# Patient Record
Sex: Male | Born: 1970
Health system: Southern US, Community
[De-identification: ages and names within clinical notes are randomized; demographics above are authoritative.]

## PROBLEM LIST (undated history)

## (undated) DIAGNOSIS — Z9889 Other specified postprocedural states: Secondary | ICD-10-CM

## (undated) DIAGNOSIS — E7800 Pure hypercholesterolemia, unspecified: Secondary | ICD-10-CM

## (undated) DIAGNOSIS — E349 Endocrine disorder, unspecified: Secondary | ICD-10-CM

## (undated) DIAGNOSIS — R9431 Abnormal electrocardiogram [ECG] [EKG]: Secondary | ICD-10-CM

## (undated) HISTORY — DX: Other specified postprocedural states: Z98.890

---

## 2004-08-07 HISTORY — PX: LAPAROSCOPY: SHX197

## 2004-12-31 ENCOUNTER — Inpatient Hospital Stay (HOSPITAL_COMMUNITY): Admission: EM | Admit: 2004-12-31 | Discharge: 2005-01-03 | Payer: Self-pay | Admitting: Emergency Medicine

## 2004-12-31 ENCOUNTER — Ambulatory Visit: Payer: Self-pay | Admitting: Gastroenterology

## 2005-01-05 ENCOUNTER — Ambulatory Visit (HOSPITAL_COMMUNITY): Admission: RE | Admit: 2005-01-05 | Discharge: 2005-01-05 | Payer: Self-pay | Admitting: Gastroenterology

## 2005-01-19 ENCOUNTER — Ambulatory Visit (HOSPITAL_COMMUNITY): Admission: RE | Admit: 2005-01-19 | Discharge: 2005-01-19 | Payer: Self-pay | Admitting: Gastroenterology

## 2005-12-31 IMAGING — CR DG ABDOMEN ACUTE W/ 1V CHEST
3 series · 3 of 3 positions shown · non-contrast
Comparison: none

CLINICAL DATA: Epigastric pain since this morning.
 ABDOMEN ? 2 VIEW:
 Supine and upright views show multiple small bowel air fluid levels as well as some air fluid levels in the colon.  These findings could be seen in gastroenteritis.  I do not see any dilated loops to suggest obstruction.  It is conceivable this could be an early small bowel obstruction, I do not favor that.  No abnormal calcifications or bony findings.  No free air.  There are two phleboliths in the right side of the pelvis.

[w chest pa]
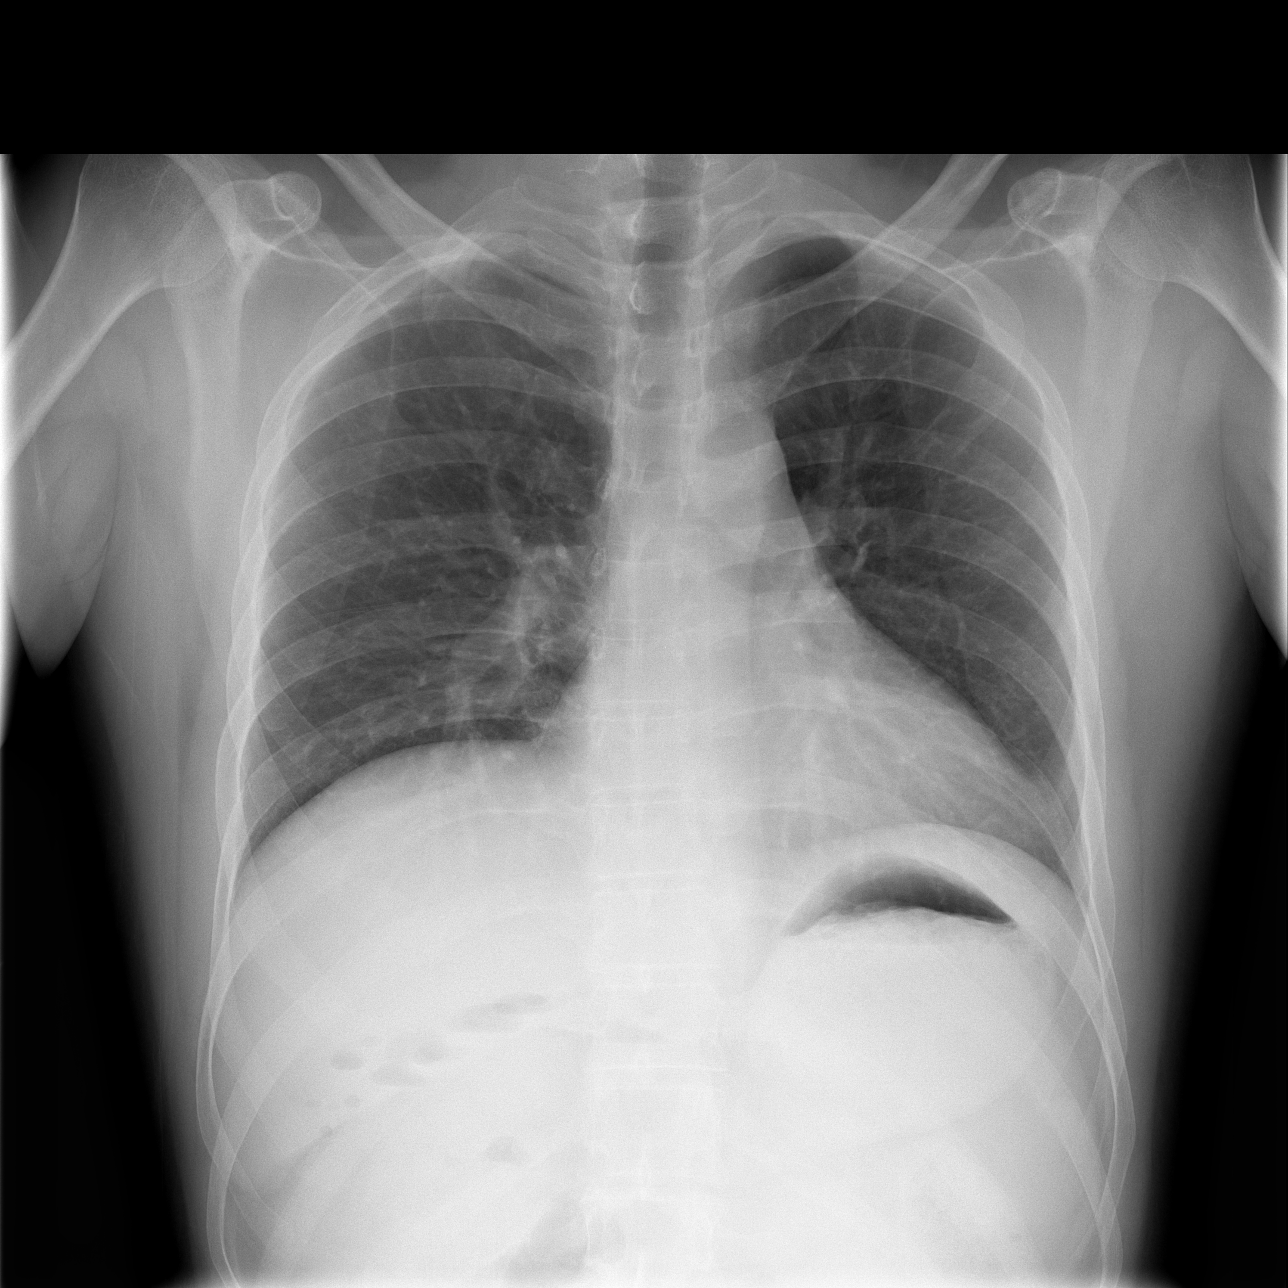

[w abdomen upright *]
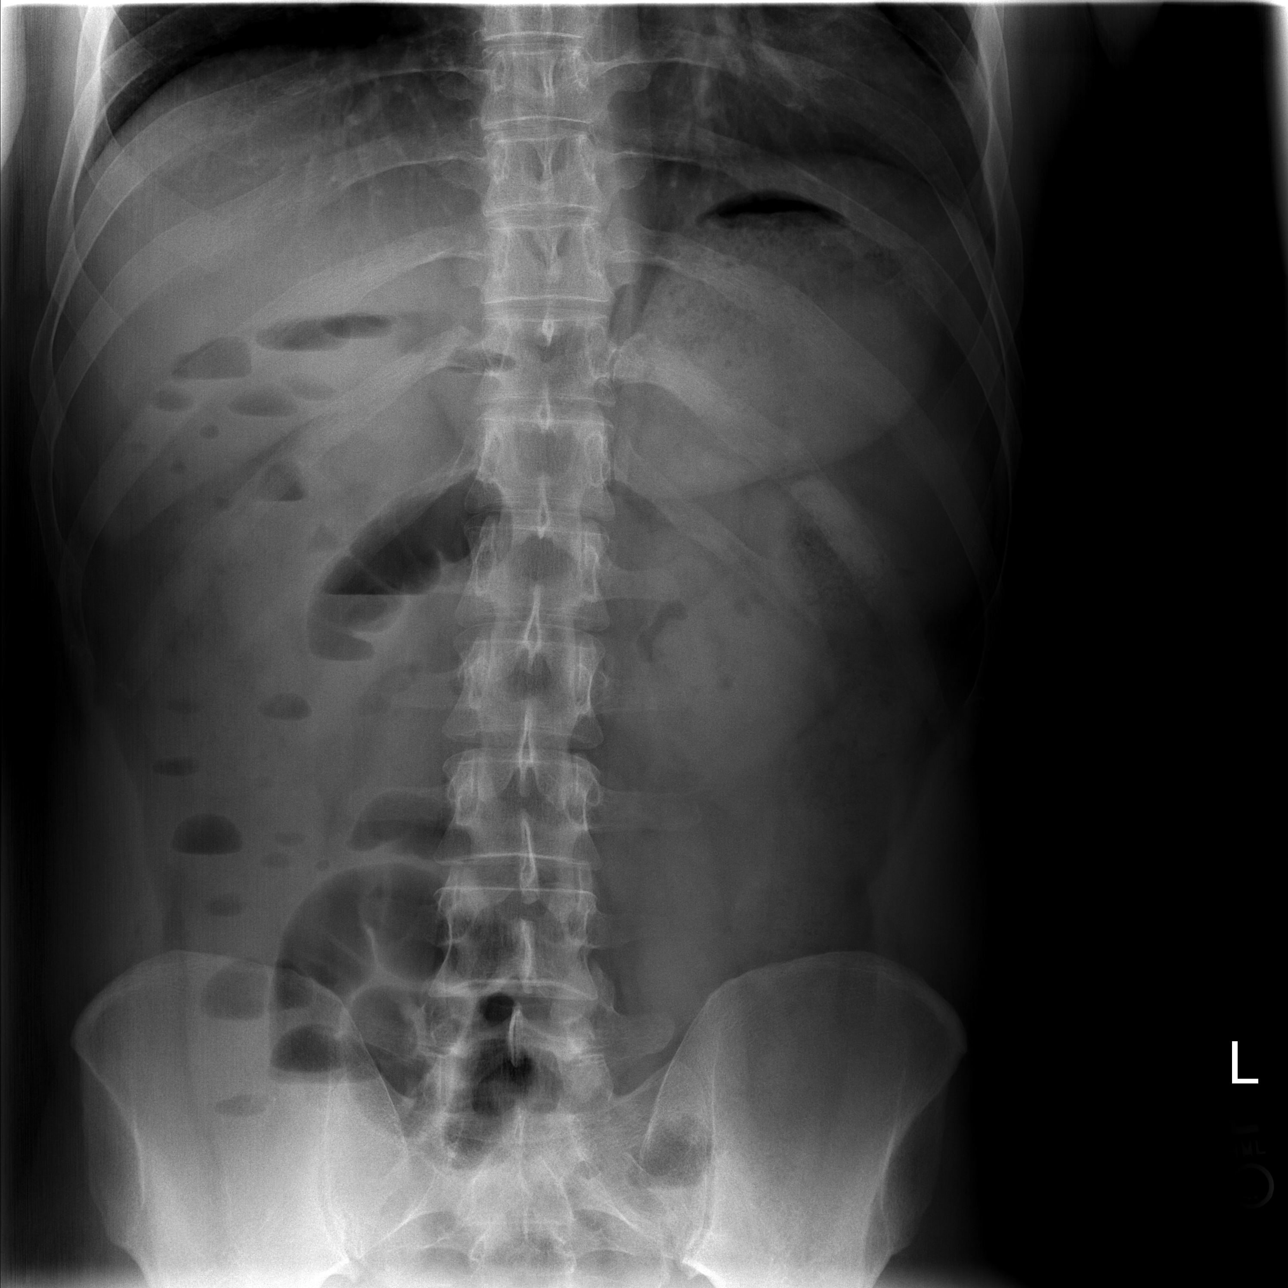

[t abdomen supine]
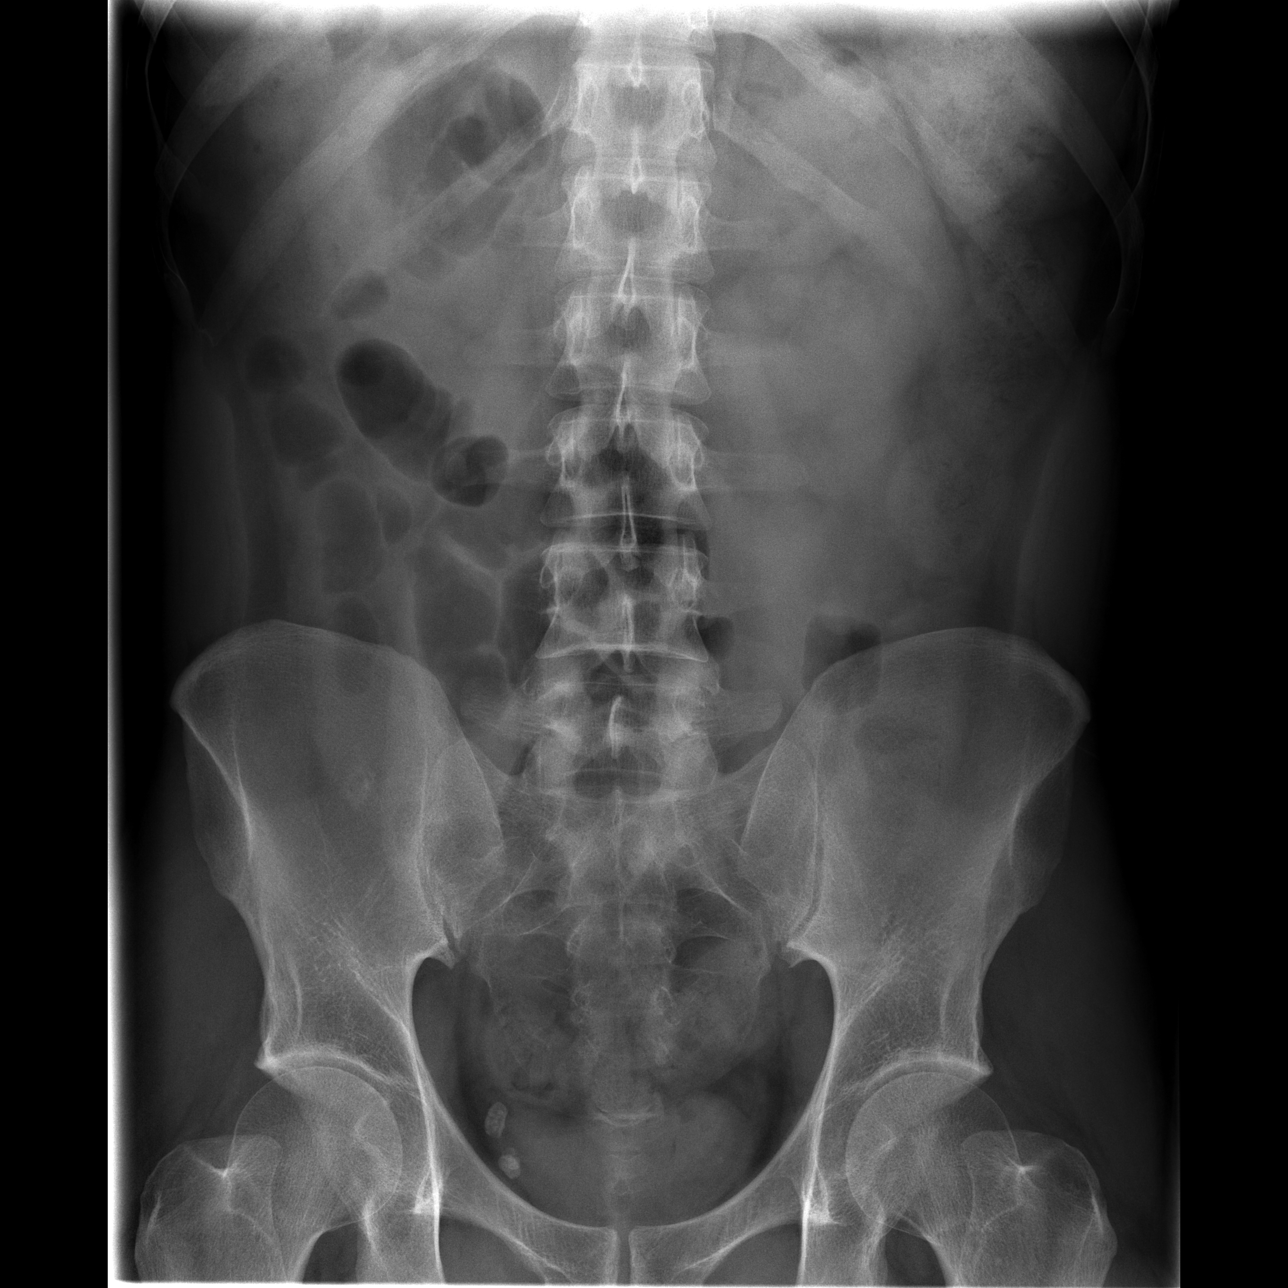

[3 of 3 positions shown; findings below may reference images not displayed]

IMPRESSION: Multiple small bowel air fluid levels as well as what I think are some air fluid levels in the colon.  I think this most likely represents gastroenteritis.
 CHEST ? 1 VIEW:
 Heart size is normal.  The mediastinum is unremarkable.  The lungs are clear.  No soft tissue or bony abnormality.  No free air under the diaphragm.
IMPRESSION: No active disease.

## 2006-01-01 IMAGING — CT CT ABDOMEN W/ CM
1 of 3 series · 14 of 32 positions shown, 19 images · IV contrast (ORAL OMNI & [ID] OMNI 300)
Comparison: none

CLINICAL DATA: abdominal pain
 CT SCAN OF THE ABDOMEN WITH CONTRAST:
 Spiral scanning is performed after oral administration of diluted contrast and during intravenous administration of 700cc of Omnipaque 300. 
 Comparison is made to plain radiographs done earlier today.
 Lung bases are clear.  The liver, gallbladder, spleen, pancreas, adrenal glands and kidneys are all normal.  The aorta and IVC are normal.  No retroperitoneal mass or adenopathy.
 There are abnormally dilated loops of small intestine, probably in the midsmall intestine.  The small bowel distal to that appears collapsed.  This raises the possibility of partial small bowel obstruction.  I cannot identify a specific etiology.  No free fluid or air.

[Series 2: routine abdomen · axial · 0.72mm/px · z∈[-486,-46]mm · 14 of 98 slices shown, 19 images]
[im 5/98  soft-tissue]
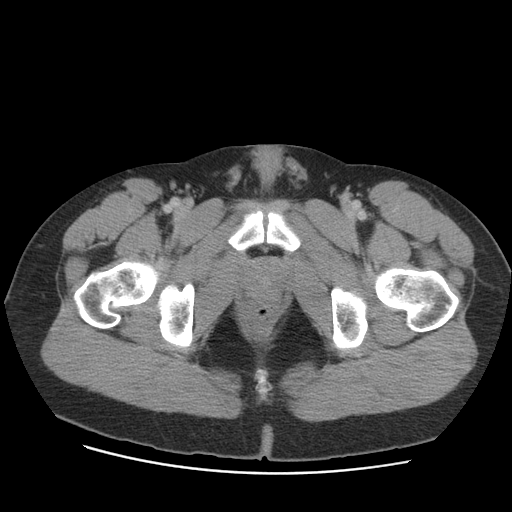
[im 5/98  bone]
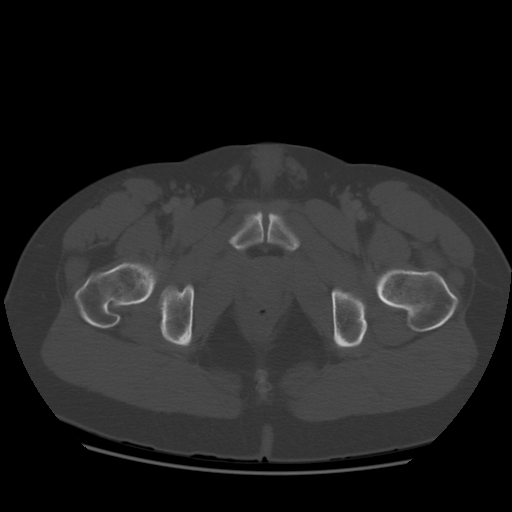
[im 15/98  soft-tissue]
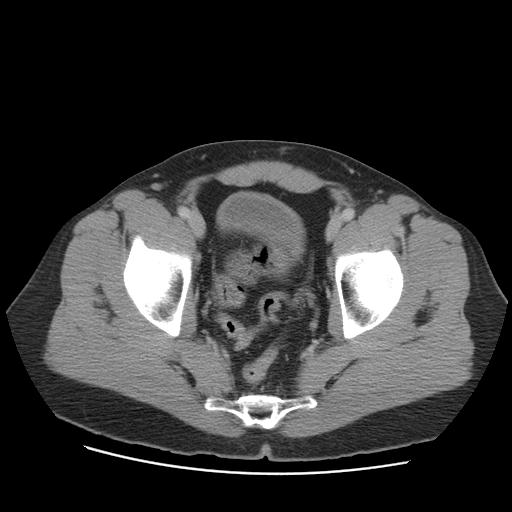
[im 20/98  soft-tissue]
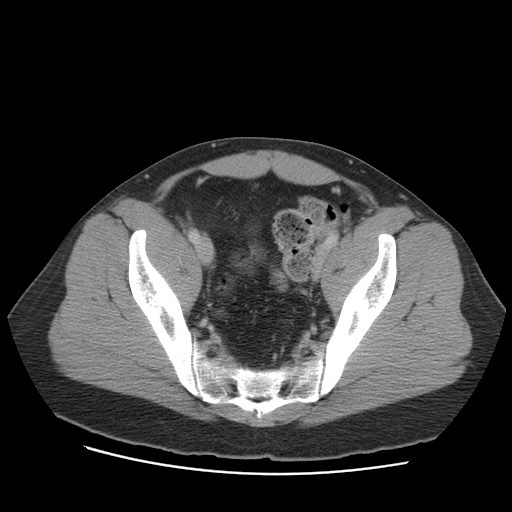
[im 30/98  soft-tissue]
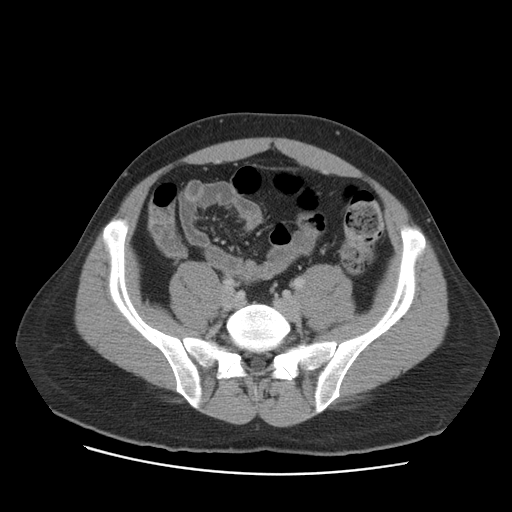
[im 34/98  soft-tissue]
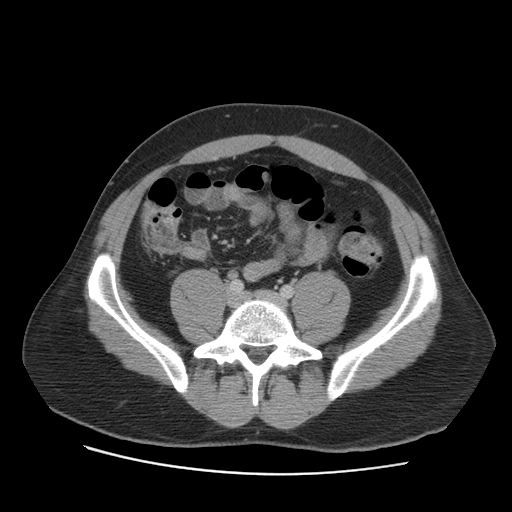
[im 44/98  soft-tissue]
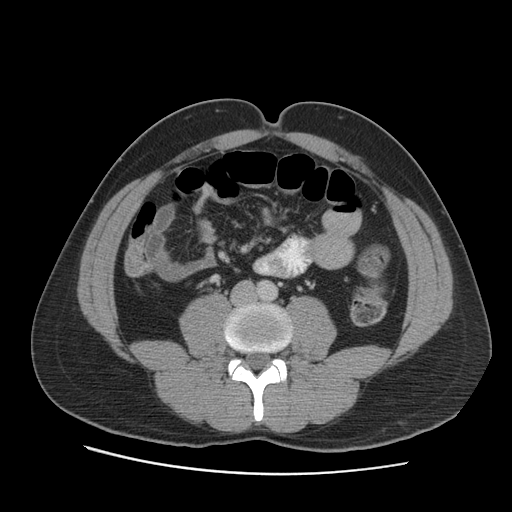
[im 49/98  soft-tissue]
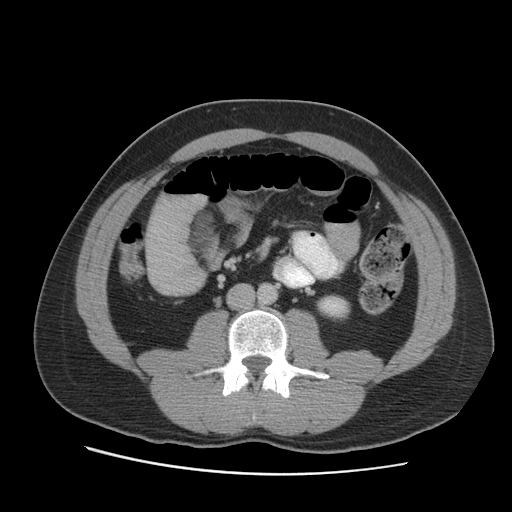
[im 54/98  soft-tissue]
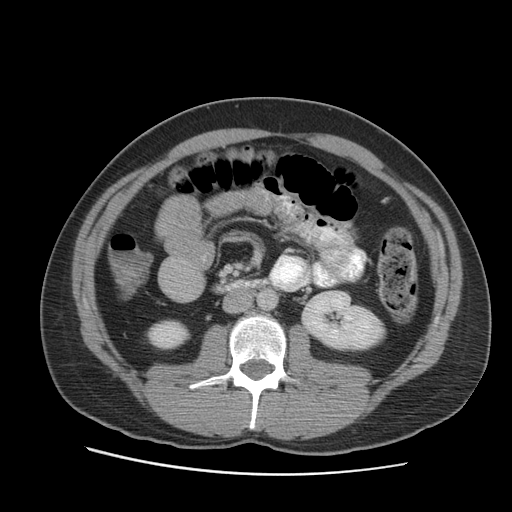
[im 64/98  soft-tissue]
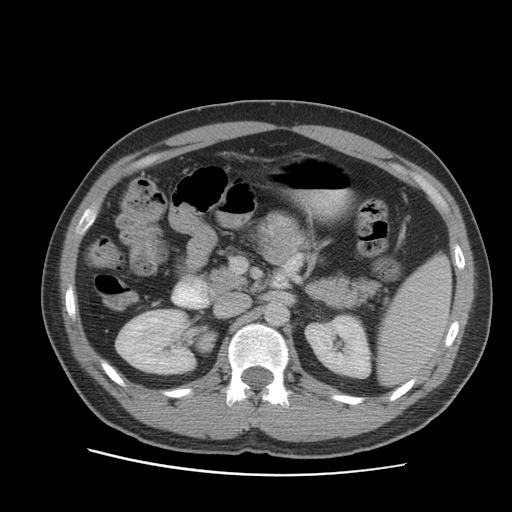
[im 64/98  bone]
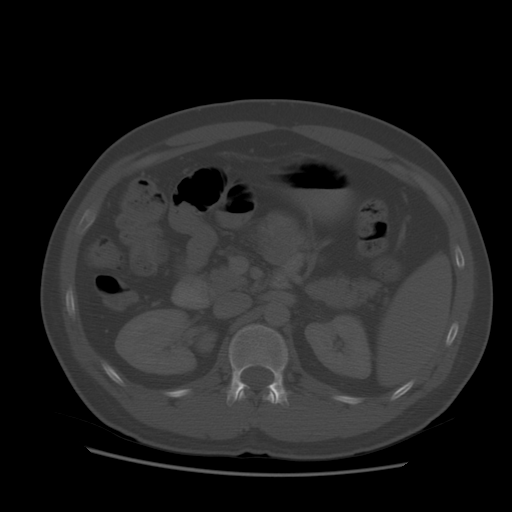
[im 68/98  soft-tissue]
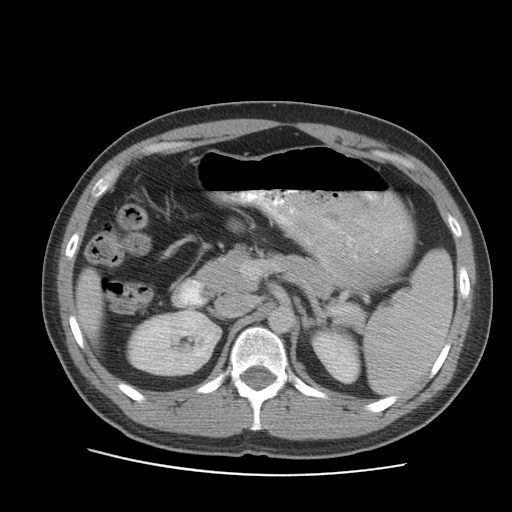
[im 78/98  soft-tissue]
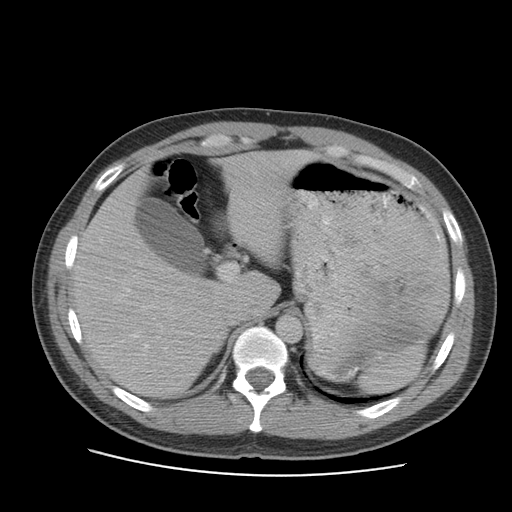
[im 78/98  lung]
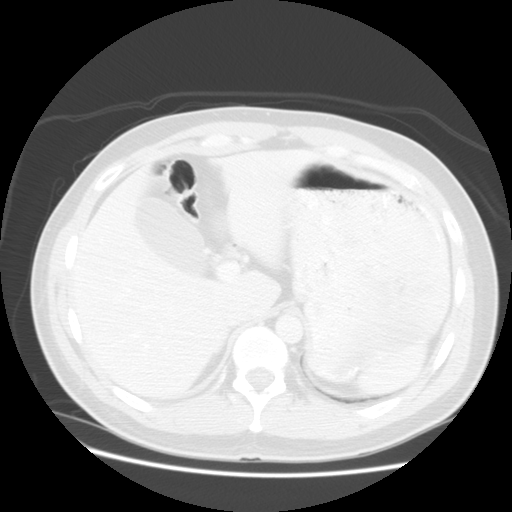
[im 83/98  soft-tissue]
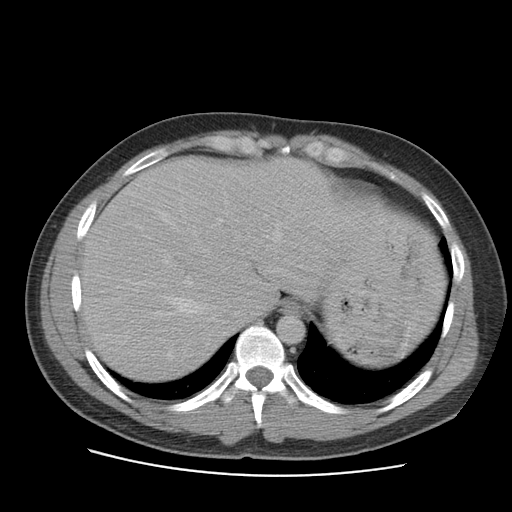
[im 83/98  lung]
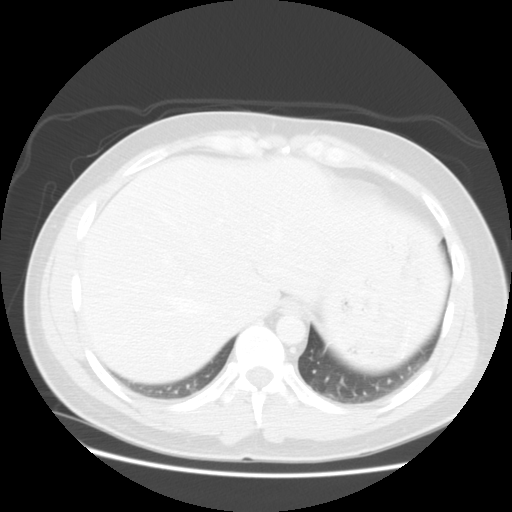
[im 88/98  lung]
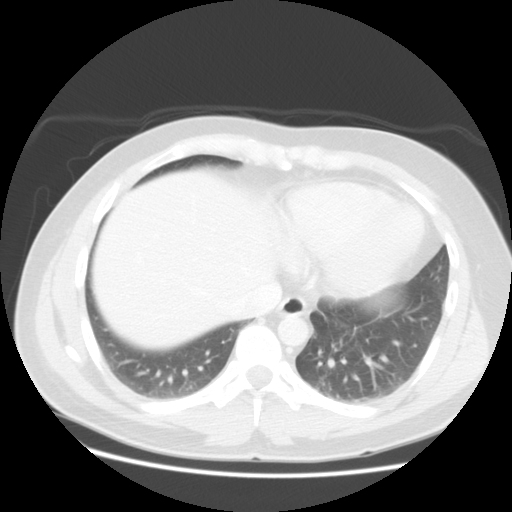
[im 93/98  soft-tissue]
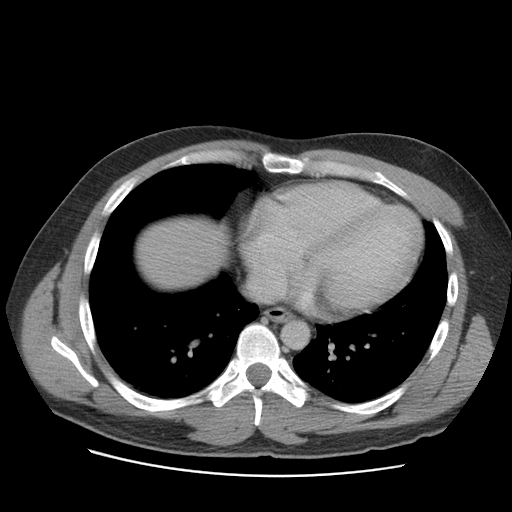
[im 93/98  lung]
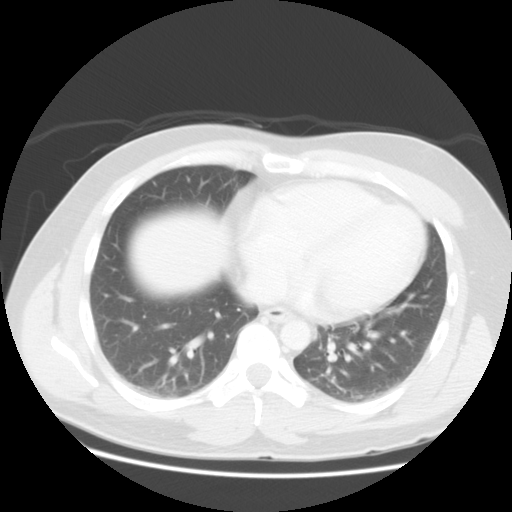

[14 of 32 positions shown; findings below may reference images not displayed]

IMPRESSION: 1.  Suspicion of partial small bowel obstruction in the midsmall bowel.  Etiology is unknown.
 CT SCAN OF THE PELVIS WITH CONTRAST:
 Spiral scanning is performed after oral and intravenous contrast administration.
 No free fluid.  The bladder, prostate gland and seminal vesicles appear normal.  No bowel pathology is seen in the pelvis.
IMPRESSION: Negative CT scan of the pelvis.

## 2009-11-28 ENCOUNTER — Emergency Department (HOSPITAL_COMMUNITY): Admission: EM | Admit: 2009-11-28 | Discharge: 2009-11-28 | Payer: Self-pay | Admitting: Emergency Medicine

## 2010-10-25 LAB — ETHANOL: Alcohol, Ethyl (B): 207 mg/dL — ABNORMAL HIGH (ref 0–10)

## 2010-12-23 NOTE — Op Note (Signed)
NAME:  Jeffrey Roach, Jeffrey Roach          ACCOUNT NO.:  [id number]   MEDICAL RECORD NO.:  [id number]          PATIENT TYPE:  INP   LOCATION:  5504                         FACILITY:  [facility]   PHYSICIAN:  Adolph Pollack, M.D.DATE OF BIRTH:  09/19/1970   DATE OF PROCEDURE:  12/31/2004  DATE OF DISCHARGE:                                 OPERATIVE REPORT   PREOPERATIVE DIAGNOSIS:  Abdominal pain and small bowel obstruction by CT  scan.   POSTOPERATIVE DIAGNOSIS:  Mild enteritis with mild ileus.   OPERATION/PROCEDURE:  Diagnostic laparoscopy.   SURGEON:  Adolph Pollack, M.D.   ANESTHESIA:  General.   INDICATIONS:  This 40 year old male was admitted with severe epigastric pain  that was crampy and wavy, some nausea but no vomiting.  He had had an  evaluation with abdominal x-rays and a CT scan that suggested small bowel  obstruction. He began spiking fevers here in the hospital.  He had  epigastric tenderness and some guarding.  Because of my concern for  potential small bowel obstruction and strangulation, I felt it was prudent  to bring him to the operating room for diagnostic laparoscopy and possible  exploratory laparotomy.   DESCRIPTION OF PROCEDURE:  He was brought to the operating room and placed  supine position on the operating room table and general anesthesia was  administered.  A Foley catheter was placed in his bladder.  Abdominal wall  was sterilely prepped and draped.  A small subumbilical incision was made  through the skin and subcutaneous tissue, fascia and peritoneum.  The  peritoneal cavity was entered.  A pursestring suture of 0 Vicryl was placed  around the fascial edges.  A Hasson trocar was introduced through the  peritoneal cavity and pneumoperitoneum was created by insufflation of CO2  gas.   Next, he was placed in the Trendelenburg position.  A 5 mm incision was made  in the lower midline and a 5 mm trocar was placed.  Using atraumatic  grasper, the  omentum on the left was mobilized cranially.  There was a small  adhesion between the omentum and cecum. This was divided sharply.  I began  running the small bowel from the terminal ileum to the ligament of Treitz.  Proximally there was a mild bit of inflammatory changes consistent with  enteritis and a little bit of dilatation but no bowel obstruction was noted.   Following this, I went ahead and removed the instruments.  I inspected the  bowel.  No injury was noted.  No bleeding was noted.  I then removed the  trocars and released the pneumoperitoneum.  The subumbilical fascia defect  was closed by tightening up and tying down the pursestring suture.  Skin  incisions were closed with 4-0 Monocryl subcuticular stitches followed by  Steri-Strips and sterile dressings.   He tolerated the procedure well without any apparent complications.  He was  taken to the recovery room in satisfactory condition.  I will place him on  IV Toradol, IV Protonix.      TJR/MEDQ  D:  12/31/2004  T:  01/01/2005  Job:  [id number]  cc:   Judie Petit T. Russella Dar, M.D. [facility]   Tanya D. Daphine Deutscher, M.D.  [address]  [place]  [place] [address]  Fax: [phone]

## 2010-12-23 NOTE — Consult Note (Signed)
Jeffrey Roach, Jeffrey Roach NO.:  [id number]   MEDICAL RECORD NO.:  [id number]          PATIENT TYPE:  INP   LOCATION:  5504                         FACILITY:  [facility]   PHYSICIAN:  Adolph Pollack, M.D.DATE OF BIRTH:  1971/04/20   DATE OF CONSULTATION:  12/31/2004  DATE OF DISCHARGE:                                   CONSULTATION   PHYSICIAN REQUESTING CONSULTATION:  Dr. Karolee Ohs.   REASON FOR CONSULTATION:  Severe epigastric abdominal pains, small-bowel  obstruction.   HISTORY OF PRESENT ILLNESS:  This is a 40 year old male, otherwise healthy  who yesterday had the onset of a tearing type epigastric pain that  progressively worsened.  He got some nausea because the pain was so severe.  He did have a bowel movement or two but had no relief of the pain and it  became severe enough that he presented to the emergency department and  subsequently was admitted earlier this morning.  Plain abdominal x-rays  demonstrated some findings essentially consistent with gastroenteritis.  He  subsequently underwent a CT scan which showed some dilated proximal bowel  with normal-appearing distal bowel consistent with bowel obstruction.  He  has now started spiking a fever here in the hospital.  He has been getting  IV analgesics and when it wears off he has recurrence of his severe pain.  I  was subsequently asked to see him.   PAST MEDICAL HISTORY:  No chronic illnesses.   PREVIOUS OPERATIONS:  None.   ALLERGIES:  None.   MEDICATIONS:  None.   SOCIAL HISTORY:  Married.  Occasional alcoholic beverage.  He has one or two  cigars a day.   FAMILY HISTORY:  Notable for colon cancer in his grandfather.   REVIEW OF SYSTEMS:  CARDIOVASCULAR: No hypertension.  No heart disease.  PULMONARY: No asthma, lung disease or pneumonia.  GI: No peptic ulcer  disease, hepatitis, diverticulitis, colitis.  GU: No kidney stones.  Some  difficulty with urination now.  ENDOCRINE: No  diabetes.  No cholesterol  problems or thyroid problems.  IMMUNOLOGIC: No known bleeding disorders or  deep vein thrombosis.  MUSCULOSKELETAL: Has some back pain.   PHYSICAL EXAMINATION:  GENERAL: An ill-appearing male.  He is medicated but  is able to answer some questions.  His temperature is 102.1.  His blood  pressure is 98/66, pulse 96.  EYES: Extraocular motions intact, no icterus.  SKIN: Warm and moist.  NECK: Supple without masses or thyroid nodule.  RESPIRATORY: Breath sounds equal and clear.  Respirations unlabored.  CARDIOVASCULAR: Heart demonstrates a regular rate and rhythm with no murmur.  ABDOMEN: Soft.  There is epigastric tenderness and some guarding to  palpation and mild tenderness to percussion.  Active bowel sounds noted.  No  masses.  No herniae.  EXTREMITIES: Good muscle tone, no cyanosis or edema.   LABORATORY DATA:  White blood cell count today is pending but yesterday is  10,700 with lipase and liver function tests normal.  Abdominal CT was  reviewed and discussed with Dr. Beckie Salts which indeed demonstrates some  dilated proximal small-bowel to the  mid portion and there is a transition  zone and normal small-bowel.   IMPRESSION:  Small-bowel obstruction without obvious reason versus enteritis  with focal ileus - fever and persistent pain as well as physical examination  are concerning for potential strangulation secondary to internal hernia or  volvulus.  I think there certainly is a need to know even if it is just  enteritis.   PLAN:  I have recommended diagnostic laparoscopy and possible exploratory  laparotomy with bowel resection.  I have discussed the procedure as well as  risks with him and his wife.  The risks include but are not limited to  bleeding, infection, wound healing problems, accidental injury to intra-  abdominal organs and risk of anesthesia.  They both seem to understand this  and have agreed to proceed.      TJR/MEDQ  D:   12/31/2004  T:  12/31/2004  Job:  [id number]   cc:   Venita Lick. Russella Dar, M.D. [facility]   Tanya D. Daphine Deutscher, M.D.  [address]  [place]  [place] [address]  Fax: [phone]

## 2010-12-23 NOTE — Discharge Summary (Signed)
NAME:  Jeffrey Roach, Jeffrey Roach               ACCOUNT NO.:  [id number]   MEDICAL RECORD NO.:  [id number]          PATIENT TYPE:  INP   LOCATION:  5525                         FACILITY:  [facility]   PHYSICIAN:  Betti D. Pecola Leisure, M.D.   DATE OF BIRTH:  29-Apr-1971   DATE OF ADMISSION:  12/30/2004  DATE OF DISCHARGE:  01/03/2005                                 DISCHARGE SUMMARY   ADMISSION DIAGNOSIS:  Abdominal pain.   DISCHARGE DIAGNOSIS:  Enteritis, presumably noninfectious.  This is a 40-  year-old white male who presented to the ER on Dec 31, 2004 with complaints  of a 4-5 day history of abdominal pain which worsened approximately 24 hours  before his ER arrival.  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  He does not state any gross blood in  his stool.  He also had denied any alcohol or drug use.  On admission the  patient had a low-grade fever with a temperature of 99, vitals were stable.  On physical examination the patient appeared to be lethargic and in some  pain.  He did have epigastric tenderness to palpation with all quadrants  being normal upon palpation.  His blood work revealed a white count of 10.7  with neutrophils of 91, his electrolytes were normal except for a blood  sugar of 142.  The patient was admitted for suspicion for Barrett's  esophagitis versus a gastritis.  GI was consulted to follow through with  this patient.  At the time of their consultation the patient's temperature  had increased to 102.6 and their comment was that the patient probably had  an ileus.  He was to be ruled out for a small bowel obstruction, NG tube  suction was placed, the patient was placed n.p.o., IV fluids given, in  addition to IV Cipro with suspicion of there being an infectious cause.  CT  of the patient's abdomen was consistent with dilated proximal small bowel.  Surgery was  consulted on the same day of admission and the patient was  prepared for a diagnostic laparoscopy to further discern whether or not this  was due to a small bowel obstruction verus other causes.  Follow-up on the  postop procedure revealed a mild enteritis, and a mild ileus.  No report of  any infection perioperatively.  The patient continued to do fine on Cipro IV  as well as n.p.o. and IV fluids.  He began to recover, but there was still  some question of whether or not his enteritis was due to Crohn's versus an  infectious cause, and at that time it was decided the patient would complete  a 10-day course of Cipro.  Also, GI considered the option of a small bowel  follow-through in 2-4 weeks to assess for ongoing enteritis, which might at  that time be suggestive of Crohn's or another chronic process.  The patient  was discharged on Jan 02, 2005, afebrile, vital signs were stable.  The  patient had tolerated at least 24 hours of a liquid diet followed by a soft  diet.  Pre discharge labs were normal with a blood sugar count of 86 and a  white count of 6.1.  The patient was discharged home with continued  progression of his diet as tolerated and was due to follow-up in the office  with Dr. Daphine Deutscher in two weeks.  The patient was to see Dr. Lurene Shadow in two  weeks and Dr. Elnoria Howard in two weeks as well.  The only medication upon discharge  was Cipro 500 mg b.i.d. for 10 days, Vicodin 5/500 1 q.4-6h. p.r.n. for  pain.           ______________________________  Isidoro Donning. Pecola Leisure, M.D.     BDR/MEDQ  D:  03/23/2005  T:  03/23/2005  Job:  [id number]

## 2011-08-08 DIAGNOSIS — Z9889 Other specified postprocedural states: Secondary | ICD-10-CM

## 2011-08-08 HISTORY — DX: Other specified postprocedural states: Z98.890

## 2013-01-02 ENCOUNTER — Encounter (HOSPITAL_COMMUNITY): Payer: Self-pay | Admitting: *Deleted

## 2013-01-02 ENCOUNTER — Emergency Department (HOSPITAL_COMMUNITY)
Admission: EM | Admit: 2013-01-02 | Discharge: 2013-01-03 | Disposition: A | Discharge status: Home / Self Care | Payer: BC Managed Care – PPO | Attending: Emergency Medicine | Admitting: Emergency Medicine

## 2013-01-02 ENCOUNTER — Emergency Department (HOSPITAL_COMMUNITY): Payer: BC Managed Care – PPO

## 2013-01-02 DIAGNOSIS — S0993XA Unspecified injury of face, initial encounter: Secondary | ICD-10-CM | POA: Insufficient documentation

## 2013-01-02 DIAGNOSIS — S060X9A Concussion with loss of consciousness of unspecified duration, initial encounter: Secondary | ICD-10-CM

## 2013-01-02 DIAGNOSIS — Y92410 Unspecified street and highway as the place of occurrence of the external cause: Secondary | ICD-10-CM | POA: Insufficient documentation

## 2013-01-02 DIAGNOSIS — Z8639 Personal history of other endocrine, nutritional and metabolic disease: Secondary | ICD-10-CM | POA: Insufficient documentation

## 2013-01-02 DIAGNOSIS — W19XXXA Unspecified fall, initial encounter: Secondary | ICD-10-CM

## 2013-01-02 DIAGNOSIS — R112 Nausea with vomiting, unspecified: Secondary | ICD-10-CM | POA: Insufficient documentation

## 2013-01-02 DIAGNOSIS — S7002XA Contusion of left hip, initial encounter: Secondary | ICD-10-CM

## 2013-01-02 DIAGNOSIS — S7000XA Contusion of unspecified hip, initial encounter: Secondary | ICD-10-CM | POA: Insufficient documentation

## 2013-01-02 DIAGNOSIS — S199XXA Unspecified injury of neck, initial encounter: Secondary | ICD-10-CM | POA: Insufficient documentation

## 2013-01-02 DIAGNOSIS — Z862 Personal history of diseases of the blood and blood-forming organs and certain disorders involving the immune mechanism: Secondary | ICD-10-CM | POA: Insufficient documentation

## 2013-01-02 DIAGNOSIS — Z79899 Other long term (current) drug therapy: Secondary | ICD-10-CM | POA: Insufficient documentation

## 2013-01-02 DIAGNOSIS — Y9389 Activity, other specified: Secondary | ICD-10-CM | POA: Insufficient documentation

## 2013-01-02 HISTORY — DX: Endocrine disorder, unspecified: E34.9

## 2013-01-02 HISTORY — DX: Pure hypercholesterolemia, unspecified: E78.00

## 2013-01-02 LAB — CBC WITH DIFFERENTIAL/PLATELET
Basophils Absolute: 0 10*3/uL (ref 0.0–0.1)
Eosinophils Absolute: 0.1 10*3/uL (ref 0.0–0.7)
Eosinophils Relative: 1 % (ref 0–5)
HCT: 42.2 % (ref 39.0–52.0)
Hemoglobin: 15 g/dL (ref 13.0–17.0)
Lymphocytes Relative: 32 % (ref 12–46)
Monocytes Relative: 6 % (ref 3–12)
Neutrophils Relative %: 61 % (ref 43–77)
RBC: 5.02 MIL/uL (ref 4.22–5.81)
RDW: 13.1 % (ref 11.5–15.5)

## 2013-01-02 LAB — COMPREHENSIVE METABOLIC PANEL
Albumin: 4.1 g/dL (ref 3.5–5.2)
Alkaline Phosphatase: 79 U/L (ref 39–117)
BUN: 14 mg/dL (ref 6–23)
CO2: 22 mEq/L (ref 19–32)
Calcium: 9.4 mg/dL (ref 8.4–10.5)
Chloride: 99 mEq/L (ref 96–112)
Creatinine, Ser: 1.01 mg/dL (ref 0.50–1.35)
Glucose, Bld: 124 mg/dL — ABNORMAL HIGH (ref 70–99)
Total Protein: 7.8 g/dL (ref 6.0–8.3)

## 2013-01-02 MED ORDER — ONDANSETRON HCL 4 MG PO TABS: 4.0000 mg | ORAL_TABLET | Freq: Four times a day (QID) | ORAL | Status: DC

## 2013-01-02 MED ORDER — OXYCODONE-ACETAMINOPHEN 5-325 MG PO TABS: 2.0000 | ORAL_TABLET | ORAL | Status: DC | PRN

## 2013-01-02 MED ORDER — HYDROMORPHONE HCL PF 1 MG/ML IJ SOLN
1.0000 mg | Freq: Once | INTRAMUSCULAR | Status: AC
  Administered 2013-01-02: 1 mg via INTRAVENOUS
  Filled 2013-01-02: qty 1

## 2013-01-02 MED ORDER — ONDANSETRON HCL 4 MG/2ML IJ SOLN
4.0000 mg | Freq: Once | INTRAMUSCULAR | Status: AC
  Administered 2013-01-02: 4 mg via INTRAVENOUS
  Filled 2013-01-02: qty 2

## 2013-01-02 NOTE — ED Provider Notes (Signed)
History     CSN: [id number]  Arrival date & time 01/02/13  2116   First MD Initiated Contact with Patient 01/02/13 2141      Chief Complaint  Patient presents with  . Fall  . Head Injury    HPI 42 year old male presents after a fall with head injury. He was sitting in the back of a truck on the toe gait when the truck was going up a hill at a very low rate of speed when he fell out backwards and landed on gravel. He struck the back of his head and landed of his back. Questionable loss of consciousness. He does have amnesia to the event. He complained of headache, neck pain, back pain, and left hip pain. Family reports that in route to the hospital he has been confused, repeating himself, complaining of severe pain and nausea, and he has vomited several times.  On arrival patient complains of headache, neck pain, and back pain. He is somnolent, but arouses easily with loud verbal stimuli. His GCS is 13. There is no deformity to his extremities, no bleeding, no obvious external signs of trauma.  He does not take any anticoagulant or antiplatelet medications.   Past Medical History  Diagnosis Date  . Hypercholesteremia   . Hypotestosteronemia     History reviewed. No pertinent past surgical history.  History reviewed. No pertinent family history.  History  Substance Use Topics  . Smoking status: Never Smoker   . Smokeless tobacco: Not on file  . Alcohol Use: 1.2 oz/week    2 Cans of beer per week      Review of Systems  Constitutional: Negative for fever and chills.  HENT: Positive for neck pain. Negative for congestion, rhinorrhea and neck stiffness.   Eyes: Negative for visual disturbance.  Respiratory: Negative for cough and shortness of breath.   Cardiovascular: Negative for chest pain and leg swelling.  Gastrointestinal: Positive for nausea and vomiting. Negative for abdominal pain and diarrhea.  Genitourinary: Negative for dysuria, urgency, frequency, flank pain  and difficulty urinating.  Musculoskeletal: Positive for back pain.  Skin: Negative for rash.  Neurological: Positive for headaches. Negative for syncope, weakness and numbness.  All other systems reviewed and are negative.    Allergies  Aspirin  Home Medications   Current Outpatient Rx  Name  Route  Sig  Dispense  Refill  . Cholecalciferol (VITAMIN D3) 10000 UNITS capsule   Oral   Take 10,000 Units by mouth daily.         . OMEGA-3 KRILL OIL 300 MG CAPS   Oral   Take 300 mg by mouth daily.         Marland Kitchen testosterone cypionate (DEPOTESTOTERONE CYPIONATE) 200 MG/ML injection   Intramuscular   Inject 200 mg into the muscle every 14 (fourteen) days.         . ondansetron (ZOFRAN) 4 MG tablet   Oral   Take 1 tablet (4 mg total) by mouth every 6 (six) hours.   20 tablet   0   . oxyCODONE-acetaminophen (PERCOCET/ROXICET) 5-325 MG per tablet   Oral   Take 2 tablets by mouth every 4 (four) hours as needed for pain. Take 1-2 tablets by mouth every 4 (four) hours as needed for pain.   40 tablet   0     BP 125/81  Pulse 94  Temp(Src) 98.4 F (36.9 C) (Oral)  Resp 21  SpO2 97%  Physical Exam  Nursing note and vitals reviewed. Constitutional:  He appears well-developed and well-nourished. No distress.  HENT:  Head: Normocephalic and atraumatic. Head is without raccoon's eyes and without Battle's sign.  Mouth/Throat: Oropharynx is clear and moist.  Eyes: Conjunctivae and EOM are normal. Pupils are equal, round, and reactive to light. No scleral icterus.  Neck: Normal range of motion. Neck supple. No JVD present. No spinous process tenderness present.  C collar placed during my evaluation of the patient  Cardiovascular: Normal rate, regular rhythm, normal heart sounds and intact distal pulses.  Exam reveals no gallop and no friction rub.   No murmur heard. Pulmonary/Chest: Effort normal and breath sounds normal. No respiratory distress. He has no wheezes. He has no rales.   Abdominal: Soft. Bowel sounds are normal. He exhibits no distension. There is no tenderness. There is no rigidity, no rebound and no guarding.  Musculoskeletal: He exhibits no edema.  Tenderness to palpation over left greater trochanter.  He has normal ROM at both hips.  Able to bear weight without pain.  Full ROM at knee and ankle without pain.  2+ DP pulse.  No deformity.  Pelvis stable to compression.    Tenderness to palpation in C, T, and L spine; but no midline tenderess, stepoffs, or deformities   Musculoskeletal exam is otherwise atraumatic  Neurological: He is alert. He has normal strength. He is disoriented (oriented to person and time, and situation, but not place; good recall of place on reevaluation). No cranial nerve deficit or sensory deficit. He exhibits normal muscle tone. Coordination and gait normal. GCS eye subscore is 3. GCS verbal subscore is 4. GCS motor subscore is 6.  Skin: Skin is warm and dry. He is not diaphoretic.    ED Course  Procedures (including critical care time)  Labs Reviewed  ETHANOL - Abnormal; Notable for the following:    Alcohol, Ethyl (B) 110 (*)    All other components within normal limits  COMPREHENSIVE METABOLIC PANEL - Abnormal; Notable for the following:    Glucose, Bld 124 (*)    All other components within normal limits  CBC WITH DIFFERENTIAL   Dg Thoracic Spine 2 View  01/02/2013   *RADIOLOGY REPORT*  Clinical Data: Status post fall off tailgate; concern for upper back injury.  THORACIC SPINE - 2 VIEW  Comparison: Chest radiograph performed 11/28/2009  Findings: There is no evidence of fracture or subluxation. Vertebral bodies demonstrate normal height and alignment. Intervertebral disc spaces are preserved.  The visualized portions of both lungs are clear.  The mediastinum is unremarkable in appearance.  IMPRESSION: No evidence of fracture or subluxation along the thoracic spine.   Original Report Authenticated By: Tonia Ghent, M.D.    Dg Lumbar Spine Complete  01/02/2013   *RADIOLOGY REPORT*  Clinical Data: Status post fall off of tailgate; concern for lower back injury.  LUMBAR SPINE - COMPLETE 4+ VIEW  Comparison: CT of the abdomen and pelvis performed 12/31/2004  Findings: There is no evidence of fracture or subluxation. Vertebral bodies demonstrate normal height and alignment. Intervertebral disc spaces are preserved.  The visualized neural foramina are grossly unremarkable in appearance.  The visualized bowel gas pattern is unremarkable in appearance; air and stool are noted within the colon.  The sacroiliac joints are within normal limits.  IMPRESSION: No evidence of fracture or subluxation along the lumbar spine.   Original Report Authenticated By: Tonia Ghent, M.D.   Dg Pelvis 1-2 Views  01/02/2013   *RADIOLOGY REPORT*  Clinical Data: Status post fall off back of  tailgate; left hip pain.  PELVIS - 1-2 VIEW  Comparison: MRI of the right hip performed 01/03/2005  Findings: There is no evidence of fracture or dislocation.  Both femoral heads are seated normally within their respective acetabula.  No significant degenerative change is appreciated.  The sacroiliac joints are unremarkable in appearance.  The visualized bowel gas pattern is grossly unremarkable in appearance.  Scattered phleboliths are noted within the pelvis.  IMPRESSION: No evidence of fracture or dislocation.   Original Report Authenticated By: Tonia Ghent, M.D.   Dg Femur Left  01/02/2013   *RADIOLOGY REPORT*  Clinical Data: Status post fall off tailgate; left hip pain.  LEFT FEMUR - 2 VIEW  Comparison: None.  Findings: There is no evidence of fracture or dislocation.  The left femur appears intact.  The left femoral head remains seated at the acetabulum.  The left sacroiliac joint is unremarkable in appearance.  The distal left femur is unremarkable.  No knee joint effusion is identified.  A fabella is noted.  No significant soft tissue abnormalities are  characterized on radiograph.  IMPRESSION: No evidence of fracture or dislocation.   Original Report Authenticated By: Tonia Ghent, M.D.   Ct Head Wo Contrast  01/02/2013   *RADIOLOGY REPORT*  Clinical Data:  Fall, head trauma  CT HEAD WITHOUT CONTRAST CT CERVICAL SPINE WITHOUT CONTRAST  Technique:  Multidetector CT imaging of the head and cervical spine was performed following the standard protocol without intravenous contrast.  Multiplanar CT image reconstructions of the cervical spine were also generated.  Comparison:  Head CT 11/28/2009  CT HEAD  Findings: No intracranial hemorrhage.  No parenchymal contusion. No midline shift or mass effect.  Basilar cisterns are patent. No skull base fracture.  No fluid in the paranasal sinuses or mastoid air cells.  IMPRESSION: No intracranial trauma  CT CERVICAL SPINE  Findings: No prevertebral soft tissue swelling.  Normal alignment of cervical vertebral bodies.  No loss of vertebral body height. Normal facet articulation.  Normal craniocervical junction.  No evidence epidural or paraspinal hematoma.  IMPRESSION: No cervical spine fracture.   Original Report Authenticated By: Genevive Bi, M.D.   Ct Cervical Spine Wo Contrast  01/02/2013   *RADIOLOGY REPORT*  Clinical Data:  Fall, head trauma  CT HEAD WITHOUT CONTRAST CT CERVICAL SPINE WITHOUT CONTRAST  Technique:  Multidetector CT imaging of the head and cervical spine was performed following the standard protocol without intravenous contrast.  Multiplanar CT image reconstructions of the cervical spine were also generated.  Comparison:  Head CT 11/28/2009  CT HEAD  Findings: No intracranial hemorrhage.  No parenchymal contusion. No midline shift or mass effect.  Basilar cisterns are patent. No skull base fracture.  No fluid in the paranasal sinuses or mastoid air cells.  IMPRESSION: No intracranial trauma  CT CERVICAL SPINE  Findings: No prevertebral soft tissue swelling.  Normal alignment of cervical vertebral  bodies.  No loss of vertebral body height. Normal facet articulation.  Normal craniocervical junction.  No evidence epidural or paraspinal hematoma.  IMPRESSION: No cervical spine fracture.   Original Report Authenticated By: Genevive Bi, M.D.     1. Concussion, with loss of consciousness of unspecified duration, initial encounter   2. Fall, initial encounter   3. Contusion of left hip, initial encounter       MDM  42 year old male presents with head injury after he fell out of the back of a truck on the gravel. On arrival with severe head pain, nausea, vomiting,  back pain, left hip pain.  His vitals are within normal limits except for mild tachycardia to 104. GCS is 13 for disorientation and his eyes spontaneously open but voice. He follows commands briskly in bilateral upper and lower strategies and moves his upper lotion these equally. His head is atraumatic to exam. He has neck tenderness but no midline tenderness. He has pain off midline throughout his thoracic and lumbar back. He has tenderness to palpation over his left greater trochanter and left iliac crest. He has no lacerations, no bruising, very superficial abrasions to the left thigh. His exam is otherwise unremarkable.  CT head, C-spine do not demonstrate any acute injury. Plain films of his thoracic and lumbar spine, pelvis, and left femur also demonstrate no acute fractures.  On reassessment, pain is improved after Dilaudid and nausea is improved after Zofran. No further vomiting. His mental status is improved and he is able to answer questions appropriately though he is slow to answer some questions. C. collar cleared given negative CT and no major distracting injury.  Patient will need to demonstrate the ability to ambulate prior to discharge. He is given a prescription for Percocet, Zofran. Given the note for work. Is given concussion instructions. He is follows primary care physician in one day for reevaluation.  On  reevaluation, patient's pain and nausea well controlled when resting; he became very nauseated and lightheaded with standing to ambulate.  However, he was able to walk without severe pain anywhere but his head.  Offered patient longer period of observation in the ED or admission for observation for symptom control, but he prefers to be discharged, which I think is reasonable.  He was assisted to his family's vehicle in a wheelchair.          Toney Sang, MD 01/03/13 1244

## 2013-01-02 NOTE — ED Notes (Signed)
Pt was sitting on the back of a tailgate when he fell off.  Denies LOC.  Pt reports pain in his head and (L) hip.  Pt slow to answer, lethargic.

## 2013-01-03 MED ORDER — ONDANSETRON HCL 4 MG/2ML IJ SOLN
4.0000 mg | Freq: Once | INTRAMUSCULAR | Status: AC
  Administered 2013-01-03: 4 mg via INTRAVENOUS
  Filled 2013-01-03: qty 2

## 2013-01-03 MED ORDER — HYDROMORPHONE HCL PF 1 MG/ML IJ SOLN
0.5000 mg | Freq: Once | INTRAMUSCULAR | Status: AC
  Administered 2013-01-03: 0.5 mg via INTRAVENOUS
  Filled 2013-01-03: qty 1

## 2013-01-07 NOTE — ED Provider Notes (Signed)
I saw and evaluated the patient, reviewed the resident's note and I agree with the findings and plan.   .Face to face Exam:  General:  Awake HEENT:  Head is without raccoon's eyes and without Battle's sign  Resp:  Normal effort Abd:  Nondistended Neuro:No focal weakness Lymph: No adenopathy  Nelia Shi, MD 01/07/13 1950

## 2014-04-29 ENCOUNTER — Emergency Department (HOSPITAL_BASED_OUTPATIENT_CLINIC_OR_DEPARTMENT_OTHER)
Admission: EM | Admit: 2014-04-29 | Discharge: 2014-04-29 | Disposition: A | Discharge status: Home / Self Care | Payer: Worker's Compensation | Attending: Emergency Medicine | Admitting: Emergency Medicine

## 2014-04-29 ENCOUNTER — Encounter (HOSPITAL_BASED_OUTPATIENT_CLINIC_OR_DEPARTMENT_OTHER): Payer: Self-pay | Admitting: Emergency Medicine

## 2014-04-29 DIAGNOSIS — E7800 Pure hypercholesterolemia, unspecified: Secondary | ICD-10-CM | POA: Diagnosis not present

## 2014-04-29 DIAGNOSIS — E291 Testicular hypofunction: Secondary | ICD-10-CM | POA: Diagnosis not present

## 2014-04-29 DIAGNOSIS — IMO0002 Reserved for concepts with insufficient information to code with codable children: Secondary | ICD-10-CM | POA: Diagnosis not present

## 2014-04-29 DIAGNOSIS — M549 Dorsalgia, unspecified: Secondary | ICD-10-CM | POA: Diagnosis present

## 2014-04-29 DIAGNOSIS — Z79899 Other long term (current) drug therapy: Secondary | ICD-10-CM | POA: Diagnosis not present

## 2014-04-29 DIAGNOSIS — M5410 Radiculopathy, site unspecified: Secondary | ICD-10-CM

## 2014-04-29 MED ORDER — DEXAMETHASONE SODIUM PHOSPHATE 10 MG/ML IJ SOLN: 10.0000 mg | Freq: Once | INTRAMUSCULAR | Status: DC

## 2014-04-29 MED ORDER — HYDROMORPHONE HCL 1 MG/ML IJ SOLN
2.0000 mg | Freq: Once | INTRAMUSCULAR | Status: AC
  Administered 2014-04-29: 2 mg via INTRAMUSCULAR
  Filled 2014-04-29: qty 2

## 2014-04-29 MED ORDER — OXYCODONE-ACETAMINOPHEN 5-325 MG PO TABS: 1.0000 | ORAL_TABLET | ORAL | Status: DC | PRN

## 2014-04-29 MED ORDER — PREDNISONE 50 MG PO TABS
60.0000 mg | ORAL_TABLET | Freq: Once | ORAL | Status: AC
  Administered 2014-04-29: 60 mg via ORAL
  Filled 2014-04-29 (×2): qty 1

## 2014-04-29 MED ORDER — PREDNISONE 10 MG PO TABS: 20.0000 mg | ORAL_TABLET | Freq: Every day | ORAL | Status: DC

## 2014-04-29 MED ORDER — ONDANSETRON 4 MG PO TBDP
4.0000 mg | ORAL_TABLET | Freq: Once | ORAL | Status: AC
  Administered 2014-04-29: 4 mg via ORAL
  Filled 2014-04-29: qty 1

## 2014-04-29 NOTE — ED Notes (Signed)
Patient asked to change into gown. 

## 2014-04-29 NOTE — ED Notes (Signed)
Pt c/o lower back pain x 2 hrs which radiates down right leg

## 2014-04-29 NOTE — Discharge Instructions (Signed)

## 2014-04-29 NOTE — ED Provider Notes (Signed)
CSN: [id number]     Arrival date & time 04/29/14  1612 History   First MD Initiated Contact with Patient 04/29/14 1715     Chief Complaint  Patient presents with  . Back Pain     (Consider location/radiation/quality/duration/timing/severity/associated sxs/prior Treatment) HPI Comments: Patient complains of back pain. He states he was pulling a lever to open up his tractor-trailer. As she was doing this he had severe pain in his right lower back. The pain radiates down his right leg. He denies any numbness or weakness in his leg although does have some tingling throughout the length. He denies any loss of bowel or bladder control. He denies any prior back injuries. He denies abdominal pain. He states the pain has been constant since it started. He describes it as a burning type pain. It's worse with any movement.  Patient is a 43 y.o. male presenting with back pain.  Back Pain Associated symptoms: no fever, no headaches, no numbness and no weakness     Past Medical History  Diagnosis Date  . Hypercholesteremia   . Hypotestosteronemia    History reviewed. No pertinent past surgical history. History reviewed. No pertinent family history. History  Substance Use Topics  . Smoking status: Never Smoker   . Smokeless tobacco: Not on file  . Alcohol Use: 1.2 oz/week    2 Cans of beer per week    Review of Systems  Constitutional: Negative for fever.  Gastrointestinal: Negative for nausea and vomiting.  Musculoskeletal: Positive for back pain. Negative for arthralgias, joint swelling and neck pain.  Skin: Negative for wound.  Neurological: Negative for weakness, numbness and headaches.      Allergies  Aspirin  Home Medications   Prior to Admission medications   Medication Sig Start Date End Date Taking? Authorizing Provider  Cholecalciferol (VITAMIN D3) 10000 UNITS capsule Take 10,000 Units by mouth daily.    Historical Provider, MD  OMEGA-3 KRILL OIL 300 MG CAPS Take 300 mg  by mouth daily.    Historical Provider, MD  ondansetron (ZOFRAN) 4 MG tablet Take 1 tablet (4 mg total) by mouth every 6 (six) hours. 01/02/13   Toney Sang, MD  oxyCODONE-acetaminophen (PERCOCET) 5-325 MG per tablet Take 1-2 tablets by mouth every 4 (four) hours as needed. 04/29/14   Rolan Bucco, MD  oxyCODONE-acetaminophen (PERCOCET/ROXICET) 5-325 MG per tablet Take 2 tablets by mouth every 4 (four) hours as needed for pain. Take 1-2 tablets by mouth every 4 (four) hours as needed for pain. 01/02/13   Toney Sang, MD  predniSONE (DELTASONE) 10 MG tablet Take 2 tablets (20 mg total) by mouth daily. 04/29/14   Rolan Bucco, MD  testosterone cypionate (DEPOTESTOTERONE CYPIONATE) 200 MG/ML injection Inject 200 mg into the muscle every 14 (fourteen) days.    Historical Provider, MD   BP 128/89  Pulse 110  Temp(Src) 98.2 F (36.8 C)  Resp 20  Ht  (1.778 m)  Wt 205 lb (92.987 kg)  BMI 29.41 kg/m2  SpO2 100% Physical Exam  Constitutional: He is oriented to person, place, and time. He appears well-developed and well-nourished.  HENT:  Head: Normocephalic and atraumatic.  Neck: Normal range of motion. Neck supple.  Cardiovascular: Normal rate.   Pulmonary/Chest: Effort normal.  Musculoskeletal: He exhibits tenderness. He exhibits no edema.  Positive tenderness to the right lower lumbar paraspinal area and along the right sciatic nerve. Positive straight leg raise on the right. Patellar reflexes symmetric. He has normal sensation and motor function in  the foot.  Neurological: He is alert and oriented to person, place, and time.  Skin: Skin is warm and dry.  Psychiatric: He has a normal mood and affect.    ED Course  Procedures (including critical care time) Labs Review Labs Reviewed - No data to display  Imaging Review No results found.   EKG Interpretation None      MDM   Final diagnoses:  Back pain with right-sided radiculopathy    Patient presents with radicular  back pain. He has no neurologic deficits or signs of cauda equina. His pain is controlled in the ED. He was given a prescription for Percocet as well as a prednisone burst. He was encouraged to have close followup with his primary care provider. He may need an MRI if his symptoms are not improving.  I advised him and his wife to return to the emergency room if he has any worsening symptoms such as leg weakness or bowel or bladder incontinence.    Rolan Bucco, MD 04/29/14 [phone]

## 2014-04-30 ENCOUNTER — Other Ambulatory Visit (HOSPITAL_BASED_OUTPATIENT_CLINIC_OR_DEPARTMENT_OTHER): Payer: Self-pay | Admitting: Family Medicine

## 2014-04-30 DIAGNOSIS — M5416 Radiculopathy, lumbar region: Secondary | ICD-10-CM

## 2014-05-02 ENCOUNTER — Ambulatory Visit (HOSPITAL_BASED_OUTPATIENT_CLINIC_OR_DEPARTMENT_OTHER): Payer: Worker's Compensation

## 2014-05-02 ENCOUNTER — Ambulatory Visit (HOSPITAL_BASED_OUTPATIENT_CLINIC_OR_DEPARTMENT_OTHER)
Admission: RE | Admit: 2014-05-02 | Discharge: 2014-05-02 | Disposition: A | Discharge status: Home / Self Care | Payer: Worker's Compensation | Source: Ambulatory Visit | Attending: Family Medicine | Admitting: Family Medicine

## 2014-05-02 DIAGNOSIS — M5416 Radiculopathy, lumbar region: Secondary | ICD-10-CM

## 2014-05-02 DIAGNOSIS — IMO0002 Reserved for concepts with insufficient information to code with codable children: Secondary | ICD-10-CM | POA: Insufficient documentation

## 2014-08-20 ENCOUNTER — Ambulatory Visit (HOSPITAL_COMMUNITY): Payer: BLUE CROSS/BLUE SHIELD | Attending: Family Medicine

## 2014-08-20 ENCOUNTER — Emergency Department (HOSPITAL_COMMUNITY)
Admission: EM | Admit: 2014-08-20 | Discharge: 2014-08-20 | Disposition: A | Discharge status: Home / Self Care | Payer: BLUE CROSS/BLUE SHIELD | Source: Home / Self Care | Attending: Family Medicine | Admitting: Family Medicine

## 2014-08-20 ENCOUNTER — Encounter (HOSPITAL_COMMUNITY): Payer: Self-pay | Admitting: Emergency Medicine

## 2014-08-20 DIAGNOSIS — R05 Cough: Secondary | ICD-10-CM | POA: Insufficient documentation

## 2014-08-20 DIAGNOSIS — R059 Cough, unspecified: Secondary | ICD-10-CM

## 2014-08-20 DIAGNOSIS — J189 Pneumonia, unspecified organism: Secondary | ICD-10-CM

## 2014-08-20 DIAGNOSIS — R918 Other nonspecific abnormal finding of lung field: Secondary | ICD-10-CM | POA: Insufficient documentation

## 2014-08-20 MED ORDER — LEVOFLOXACIN 500 MG PO TABS: 500.0000 mg | ORAL_TABLET | Freq: Every day | ORAL | Status: DC

## 2014-08-20 MED ORDER — GUAIFENESIN-CODEINE 100-10 MG/5ML PO SOLN: 5.0000 mL | Freq: Every evening | ORAL | Status: DC | PRN

## 2014-08-20 MED ORDER — IPRATROPIUM-ALBUTEROL 0.5-2.5 (3) MG/3ML IN SOLN
RESPIRATORY_TRACT | Status: AC
  Filled 2014-08-20: qty 3

## 2014-08-20 MED ORDER — IPRATROPIUM-ALBUTEROL 0.5-2.5 (3) MG/3ML IN SOLN
3.0000 mL | Freq: Once | RESPIRATORY_TRACT | Status: AC
  Administered 2014-08-20: 3 mL via RESPIRATORY_TRACT

## 2014-08-20 NOTE — ED Provider Notes (Signed)
Jeffrey Roach is a 44 y.o. male who presents to Urgent Care today for cough. Patient has a 4 days history of cough. The cough is quite bothersome and preventing sleep. The cough is not productive. The cough produces chest pain. The pain is also worse with deep inspiration and does not radiate. No fevers or chills. Patient does note body aches. He also notes chest tightness but no wheezing.    Past Medical History  Diagnosis Date  . Hypercholesteremia   . Hypotestosteronemia    History reviewed. No pertinent past surgical history. History  Substance Use Topics  . Smoking status: Never Smoker   . Smokeless tobacco: Not on file  . Alcohol Use: 1.2 oz/week    2 Cans of beer per week   ROS as above Medications: No current facility-administered medications for this encounter.   Current Outpatient Prescriptions  Medication Sig Dispense Refill  . Cholecalciferol (VITAMIN D3) 10000 UNITS capsule Take 10,000 Units by mouth daily.    Marland Kitchen guaiFENesin-codeine 100-10 MG/5ML syrup Take 5 mLs by mouth at bedtime as needed for cough. 240 mL 0  . levofloxacin (LEVAQUIN) 500 MG tablet Take 1 tablet (500 mg total) by mouth daily. 10 tablet 0  . OMEGA-3 KRILL OIL 300 MG CAPS Take 300 mg by mouth daily.    . ondansetron (ZOFRAN) 4 MG tablet Take 1 tablet (4 mg total) by mouth every 6 (six) hours. 20 tablet 0  . oxyCODONE-acetaminophen (PERCOCET) 5-325 MG per tablet Take 1-2 tablets by mouth every 4 (four) hours as needed. 20 tablet 0  . oxyCODONE-acetaminophen (PERCOCET/ROXICET) 5-325 MG per tablet Take 2 tablets by mouth every 4 (four) hours as needed for pain. Take 1-2 tablets by mouth every 4 (four) hours as needed for pain. 40 tablet 0  . predniSONE (DELTASONE) 10 MG tablet Take 2 tablets (20 mg total) by mouth daily. 10 tablet 0  . testosterone cypionate (DEPOTESTOTERONE CYPIONATE) 200 MG/ML injection Inject 200 mg into the muscle every 14 (fourteen) days.     Allergies  Allergen Reactions  .  Aspirin      Exam:  BP 120/84 mmHg  Pulse 90  Temp(Src) 99 F (37.2 C) (Oral)  Resp 18  SpO2 96% Gen: Well NAD HEENT: EOMI,  MMM normal posterior pharynx.  Lungs: Normal work of breathing. Course breath sounds BL worse left lower lung.  Heart: RRR no MRG Abd: NABS, Soft. Nondistended, Nontender Exts: Brisk capillary refill, warm and well perfused.   Patient was given a 2.5/0.5 duoneb treatment and did not feel much better  No results found for this or any previous visit (from the past 24 hour(s)). Dg Chest 2 View  08/20/2014   CLINICAL DATA:  Productive cough.  EXAM: CHEST  2 VIEW  COMPARISON:  11/28/2009.  12/30/2004.  FINDINGS: Mediastinum and hilar structures normal. Ill-defined density left perihilar region, most likely in the superior segment left lower lobe. Close follow-up chest x-rays recommended to demonstrate clearing. Mild diffuse interstitial prominence noted. Although a component of chronic interstitial lung disease cannot be excluded, active pneumonitis cannot be excluded. No pleural effusion or pneumothorax. No acute bony abnormality .  IMPRESSION: 1. Ill-defined density in the left mid lung, most likely superior segment left lower lobe. This most likely represents a focus of pneumonia. Close follow-up chest x-rays scratch recommended to demonstrate clearing to exclude underlying mass lesion. 2. Mild diffuse pulmonary interstitial prominence. Although a component of chronic interstitial lung disease cannot be excluded. Bilateral active pneumonitis cannot be  excluded.   Electronically Signed   By: Maisie Fushomas  Register   On: 08/20/2014 12:39    Assessment and Plan: [age] y.o. male with community-acquired pneumonia. Treat with Levaquin and codeine cough medication. Repeat chest x-ray in one month with PCP.  Discussed warning signs or symptoms. Please see discharge instructions. Patient expresses understanding.     Rodolph BongEvan S Airis Barbee, MD 08/20/14 [phone]

## 2014-08-20 NOTE — ED Notes (Signed)
C/o  URI symptoms x 7 days.  No relief with otc meds.  Pt triaged and assessed by provider.   Provider in before nurse.

## 2014-08-20 NOTE — Discharge Instructions (Signed)
Thank you for coming in today. Take Levaquin daily for pneumonia Use cough medicine up to every 6 hours as needed for cough. Do not drive after taking this medicine. Call or go to the emergency room if you get worse, have trouble breathing, have chest pains, or palpitations.  Follow-up with your primary care doctor in about a month for repeat chest x-ray.  Pneumonia Pneumonia is an infection of the lungs.  CAUSES Pneumonia may be caused by bacteria or a virus. Usually, these infections are caused by breathing infectious particles into the lungs (respiratory tract). SIGNS AND SYMPTOMS   Cough.  Fever.  Chest pain.  Increased rate of breathing.  Wheezing.  Mucus production. DIAGNOSIS  If you have the common symptoms of pneumonia, your health care provider will typically confirm the diagnosis with a chest X-ray. The X-ray will show an abnormality in the lung (pulmonary infiltrate) if you have pneumonia. Other tests of your blood, urine, or sputum may be done to find the specific cause of your pneumonia. Your health care provider may also do tests (blood gases or pulse oximetry) to see how well your lungs are working. TREATMENT  Some forms of pneumonia may be spread to other people when you cough or sneeze. You may be asked to wear a mask before and during your exam. Pneumonia that is caused by bacteria is treated with antibiotic medicine. Pneumonia that is caused by the influenza virus may be treated with an antiviral medicine. Most other viral infections must run their course. These infections will not respond to antibiotics.  HOME CARE INSTRUCTIONS   Cough suppressants may be used if you are losing too much rest. However, coughing protects you by clearing your lungs. You should avoid using cough suppressants if you can.  Your health care provider may have prescribed medicine if he or she thinks your pneumonia is caused by bacteria or influenza. Finish your medicine even if you start to  feel better.  Your health care provider may also prescribe an expectorant. This loosens the mucus to be coughed up.  Take medicines only as directed by your health care provider.  Do not smoke. Smoking is a common cause of bronchitis and can contribute to pneumonia. If you are a smoker and continue to smoke, your cough may last several weeks after your pneumonia has cleared.  A cold steam vaporizer or humidifier in your room or home may help loosen mucus.  Coughing is often worse at night. Sleeping in a semi-upright position in a recliner or using a couple pillows under your head will help with this.  Get rest as you feel it is needed. Your body will usually let you know when you need to rest. PREVENTION A pneumococcal shot (vaccine) is available to prevent a common bacterial cause of pneumonia. This is usually suggested for:  People over 35 years old.  Patients on chemotherapy.  People with chronic lung problems, such as bronchitis or emphysema.  People with immune system problems. If you are over 65 or have a high risk condition, you may receive the pneumococcal vaccine if you have not received it before. In some countries, a routine influenza vaccine is also recommended. This vaccine can help prevent some cases of pneumonia.You may be offered the influenza vaccine as part of your care. If you smoke, it is time to quit. You may receive instructions on how to stop smoking. Your health care provider can provide medicines and counseling to help you quit. SEEK MEDICAL CARE IF:  You have a fever. SEEK IMMEDIATE MEDICAL CARE IF:   Your illness becomes worse. This is especially true if you are elderly or weakened from any other disease.  You cannot control your cough with suppressants and are losing sleep.  You begin coughing up blood.  You develop pain which is getting worse or is uncontrolled with medicines.  Any of the symptoms which initially brought you in for treatment are  getting worse rather than better.  You develop shortness of breath or chest pain. MAKE SURE YOU:   Understand these instructions.  Will watch your condition.  Will get help right away if you are not doing well or get worse. Document Released: 07/24/2005 Document Revised: 12/08/2013 Document Reviewed: 10/13/2010 [facility] Patient Information 2015 [employer] [place]. This information is not intended to replace advice given to you by your health care provider. Make sure you discuss any questions you have with your health care provider.

## 2014-08-21 ENCOUNTER — Telehealth (HOSPITAL_COMMUNITY): Payer: Self-pay | Admitting: Family Medicine

## 2014-08-21 MED ORDER — HYDROCODONE-HOMATROPINE 5-1.5 MG/5ML PO SYRP: 5.0000 mL | ORAL_SOLUTION | Freq: Four times a day (QID) | ORAL | Status: DC | PRN

## 2014-08-21 NOTE — ED Notes (Signed)
Patient was prescribed codeine guaifenesin cough syrup yesterday. This is causing nausea and irritation. Will switch to Hycodan area and his wife will come and pick up the new prescription later today.  Rodolph BongEvan S Russ Looper, MD 08/21/14 1128

## 2015-03-12 ENCOUNTER — Other Ambulatory Visit: Payer: Self-pay | Admitting: Neurosurgery

## 2015-03-12 DIAGNOSIS — M545 Low back pain, unspecified: Secondary | ICD-10-CM

## 2015-03-12 DIAGNOSIS — G8929 Other chronic pain: Secondary | ICD-10-CM

## 2015-03-15 ENCOUNTER — Other Ambulatory Visit: Payer: Self-pay | Admitting: Specialist

## 2015-03-15 DIAGNOSIS — M545 Low back pain: Principal | ICD-10-CM

## 2015-03-15 DIAGNOSIS — G8929 Other chronic pain: Secondary | ICD-10-CM

## 2015-03-19 ENCOUNTER — Ambulatory Visit
Admission: RE | Admit: 2015-03-19 | Discharge: 2015-03-19 | Disposition: A | Discharge status: Home / Self Care | Payer: Worker's Compensation | Source: Ambulatory Visit | Attending: Specialist | Admitting: Specialist

## 2015-03-19 DIAGNOSIS — G8929 Other chronic pain: Secondary | ICD-10-CM

## 2015-03-19 DIAGNOSIS — M545 Low back pain: Principal | ICD-10-CM

## 2015-03-19 MED ORDER — IOHEXOL 180 MG/ML  SOLN
15.0000 mL | Freq: Once | INTRAMUSCULAR | Status: DC | PRN
  Administered 2015-03-19: 15 mL via INTRATHECAL

## 2015-03-19 MED ORDER — DIAZEPAM 5 MG PO TABS
10.0000 mg | ORAL_TABLET | Freq: Once | ORAL | Status: AC
  Administered 2015-03-19: 5 mg via ORAL

## 2015-03-19 MED ORDER — MEPERIDINE HCL 100 MG/ML IJ SOLN
75.0000 mg | Freq: Once | INTRAMUSCULAR | Status: AC
  Administered 2015-03-19: 75 mg via INTRAMUSCULAR

## 2015-03-19 MED ORDER — ONDANSETRON HCL 4 MG/2ML IJ SOLN
4.0000 mg | Freq: Once | INTRAMUSCULAR | Status: AC
  Administered 2015-03-19: 4 mg via INTRAMUSCULAR

## 2015-03-19 NOTE — Discharge Instructions (Signed)

## 2015-12-24 ENCOUNTER — Emergency Department
Admission: EM | Admit: 2015-12-24 | Discharge: 2015-12-24 | Disposition: A | Discharge status: Home / Self Care | Payer: 59 | Source: Home / Self Care | Attending: Family Medicine | Admitting: Family Medicine

## 2015-12-24 DIAGNOSIS — R599 Enlarged lymph nodes, unspecified: Secondary | ICD-10-CM | POA: Diagnosis not present

## 2015-12-24 DIAGNOSIS — R590 Localized enlarged lymph nodes: Secondary | ICD-10-CM

## 2015-12-24 DIAGNOSIS — L03115 Cellulitis of right lower limb: Secondary | ICD-10-CM | POA: Diagnosis not present

## 2015-12-24 MED ORDER — CLINDAMYCIN HCL 300 MG PO CAPS: 300.0000 mg | ORAL_CAPSULE | Freq: Three times a day (TID) | ORAL | Status: DC

## 2015-12-24 NOTE — ED Provider Notes (Signed)
CSN: [id number]     Arrival date & time 12/24/15  1802 History   First MD Initiated Contact with Patient 12/24/15 1831     Chief Complaint  Patient presents with  . Rash    right leg   (Consider location/radiation/quality/duration/timing/severity/associated sxs/prior Treatment) HPI The pt is a 45yo [facility]/o sudden onset body aches, fatigue, night sweats and a erythematous rash on his Right leg last night, associated painful lymph nodes in Right groin.  Symptoms have gradually worsened today. No relief with ibuprofen. Hx of exact same symptoms 14 months ago with body aches, lymph nodes and cellulitis of Right leg.  He also c/o nausea. He does not recall antibiotic he was on the first time this happened but symptoms did resolve completely. Denies injury to Right leg.   Past Medical History  Diagnosis Date  . Hypercholesteremia   . Hypotestosteronemia    History reviewed. No pertinent past surgical history. History reviewed. No pertinent family history. Social History  Substance Use Topics  . Smoking status: Never Smoker   . Smokeless tobacco: None  . Alcohol Use: 1.2 oz/week    2 Cans of beer per week    Review of Systems  Constitutional: Positive for fever, chills and fatigue.  Gastrointestinal: Positive for nausea. Negative for vomiting, abdominal pain and diarrhea.  Musculoskeletal: Positive for myalgias and arthralgias. Negative for joint swelling.  Skin: Positive for color change and rash. Negative for pallor and wound.  Neurological: Negative for weakness and numbness.    Allergies  Aspirin  Home Medications   Prior to Admission medications   Medication Sig Start Date End Date Taking? Authorizing Provider  Cholecalciferol (VITAMIN D3) 10000 UNITS capsule Take 10,000 Units by mouth daily.    Historical Provider, MD  clindamycin (CLEOCIN) 300 MG capsule Take 1 capsule (300 mg total) by mouth 3 (three) times daily. X 7 days 12/24/15   Junius FinnerErin O'Malley,  PA-C  guaiFENesin-codeine 100-10 MG/5ML syrup Take 5 mLs by mouth at bedtime as needed for cough. 08/20/14   Rodolph BongEvan S Corey, MD  HYDROcodone-homatropine [facility](HYCODAN) 5-1.5 MG/5ML syrup Take 5 mLs by mouth every 6 (six) hours as needed for cough. 08/21/14   Rodolph BongEvan S Corey, MD  levofloxacin (LEVAQUIN) 500 MG tablet Take 1 tablet (500 mg total) by mouth daily. 08/20/14   Rodolph BongEvan S Corey, MD  OMEGA-3 KRILL OIL 300 MG CAPS Take 300 mg by mouth daily.    Historical Provider, MD  ondansetron (ZOFRAN) 4 MG tablet Take 1 tablet (4 mg total) by mouth every 6 (six) hours. 01/02/13   Toney SangJerrid Pippin, MD  oxyCODONE-acetaminophen (PERCOCET) 5-325 MG per tablet Take 1-2 tablets by mouth every 4 (four) hours as needed. 04/29/14   Rolan BuccoMelanie Belfi, MD  oxyCODONE-acetaminophen (PERCOCET/ROXICET) 5-325 MG per tablet Take 2 tablets by mouth every 4 (four) hours as needed for pain. Take 1-2 tablets by mouth every 4 (four) hours as needed for pain. 01/02/13   Toney SangJerrid Pippin, MD  predniSONE (DELTASONE) 10 MG tablet Take 2 tablets (20 mg total) by mouth daily. 04/29/14   Rolan BuccoMelanie Belfi, MD  testosterone cypionate (DEPOTESTOTERONE CYPIONATE) 200 MG/ML injection Inject 200 mg into the muscle every 14 (fourteen) days.    Historical Provider, MD   Meds Ordered and Administered this Visit  Medications - No data to display  BP 140/90 mmHg  Pulse 88  Temp(Src) 99.1 F (37.3 C) (Oral)  Ht 5\' 11"  (1.803 m)  Wt 204 lb 12 oz (92.874 kg)  BMI 28.57 kg/m2  SpO2 98% No data found.   Physical Exam  Constitutional: He is oriented to person, place, and time. He appears well-developed and well-nourished.  HENT:  Head: Normocephalic and atraumatic.  Eyes: EOM are normal.  Neck: Normal range of motion.  Cardiovascular: Normal rate.   Pulmonary/Chest: Effort normal.  Genitourinary:  Right inguinal lymphadenopathy   Musculoskeletal: Normal range of motion. He exhibits edema and tenderness.  Right anterior lower leg swelling and redness. Full ROM  ankle and knee w/o tenderness. Calf is soft, non-tender.   Neurological: He is alert and oriented to person, place, and time.  Skin: Skin is warm and dry. There is erythema.  Right anterior leg: 8"x3" area of erythema, warmth, mild edema with tenderness. No induration or fluctuance. No bleeding or drainage.   Psychiatric: He has a normal mood and affect. His behavior is normal.  Nursing note and vitals reviewed.   ED Course  Procedures (including critical care time)  Labs Review Labs Reviewed - No data to display  Imaging Review No results found.    MDM   1. Cellulitis of leg, right   2. Lymphadenopathy, inguinal    Sudden onset cellulitis Right lower leg w/o known cause. Hx of same about 14 months ago.   Rx: Clindamycin  Encouraged warm compresses and elevation.  May have acetaminophen and ibuprofen for pain and swelling. F/u with PCP next week for further evaluation and recheck of symptoms. Patient verbalized understanding and agreement with treatment plan.     Junius Finner, PA-C 12/24/15 1916

## 2015-12-24 NOTE — ED Notes (Signed)
Rash appeared on right leg this am with swollen lymph nodes in groin area.  Rash on shin are about 8"x3".  Red and slight swelling noted.  No drainage noted.

## 2015-12-24 NOTE — Discharge Instructions (Signed)
Cellulitis Cellulitis is an infection of the skin and the tissue beneath it. The infected area is usually red and tender. Cellulitis occurs most often in the arms and lower legs.  CAUSES  Cellulitis is caused by bacteria that enter the skin through cracks or cuts in the skin. The most common types of bacteria that cause cellulitis are staphylococci and streptococci. SIGNS AND SYMPTOMS   Redness and warmth.  Swelling.  Tenderness or pain.  Fever. DIAGNOSIS  Your health care provider can usually determine what is wrong based on a physical exam. Blood tests may also be done. TREATMENT  Treatment usually involves taking an antibiotic medicine. HOME CARE INSTRUCTIONS   Take your antibiotic medicine as directed by your health care provider. Finish the antibiotic even if you start to feel better.  Keep the infected arm or leg elevated to reduce swelling.  Apply a warm cloth to the affected area up to 4 times per day to relieve pain.  Take medicines only as directed by your health care provider.  Keep all follow-up visits as directed by your health care provider. SEEK MEDICAL CARE IF:   You notice red streaks coming from the infected area.  Your red area gets larger or turns dark in color.  Your bone or joint underneath the infected area becomes painful after the skin has healed.  Your infection returns in the same area or another area.  You notice a swollen bump in the infected area.  You develop new symptoms.  You have a fever. SEEK IMMEDIATE MEDICAL CARE IF:   You feel very sleepy.  You develop vomiting or diarrhea.  You have a general ill feeling (malaise) with muscle aches and pains.   This information is not intended to replace advice given to you by your health care provider. Make sure you discuss any questions you have with your health care provider.   Document Released: 05/03/2005 Document Revised: 04/14/2015 Document Reviewed: 10/09/2011 Elsevier Interactive  Patient Education 2016 [employer].  Lymphadenopathy Lymphadenopathy refers to swollen or enlarged lymph glands, also called lymph nodes. Lymph glands are part of your body's defense (immune) system, which protects the body from infections, germs, and diseases. Lymph glands are found in many locations in your body, including the neck, underarm, and groin.  Many things can cause lymph glands to become enlarged. When your immune system responds to germs, such as viruses or bacteria, infection-fighting cells and fluid build up. This causes the glands to grow in size. Usually, this is not something to worry about. The swelling and any soreness often go away without treatment. However, swollen lymph glands can also be caused by a number of diseases. Your health care provider may do various tests to help determine the cause. If the cause of your swollen lymph glands cannot be found, it is important to monitor your condition to make sure the swelling goes away. HOME CARE INSTRUCTIONS Watch your condition for any changes. The following actions may help to lessen any discomfort you are feeling:  Get plenty of rest.  Take medicines only as directed by your health care provider. Your health care provider may recommend over-the-counter medicines for pain.  Apply moist heat compresses to the site of swollen lymph nodes as directed by your health care provider. This can help reduce any pain.  Check your lymph nodes daily for any changes.  Keep all follow-up visits as directed by your health care provider. This is important. SEEK MEDICAL CARE IF:  Your lymph nodes  are still swollen after 2 weeks.  Your swelling increases or spreads to other areas.  Your lymph nodes are hard, seem fixed to the skin, or are growing rapidly.  Your skin over the lymph nodes is red and inflamed.  You have a fever.  You have chills.  You have fatigue.  You develop a sore throat.  You have abdominal pain.  You have  weight loss.  You have night sweats. SEEK IMMEDIATE MEDICAL CARE IF:  You notice fluid leaking from the area of the enlarged lymph node.  You have severe pain in any area of your body.  You have chest pain.  You have shortness of breath.   This information is not intended to replace advice given to you by your health care provider. Make sure you discuss any questions you have with your health care provider.   Document Released: 05/02/2008 Document Revised: 08/14/2014 Document Reviewed: 02/26/2014 Elsevier Interactive Patient Education [employer] Else[employer] Inc.

## 2015-12-27 DIAGNOSIS — L03115 Cellulitis of right lower limb: Secondary | ICD-10-CM | POA: Diagnosis not present

## 2016-03-07 HISTORY — PX: BACK SURGERY: SHX140

## 2016-06-05 ENCOUNTER — Encounter (HOSPITAL_COMMUNITY): Payer: Self-pay | Admitting: Emergency Medicine

## 2016-06-05 ENCOUNTER — Emergency Department (HOSPITAL_BASED_OUTPATIENT_CLINIC_OR_DEPARTMENT_OTHER): Admit: 2016-06-05 | Discharge: 2016-06-05 | Disposition: A | Discharge status: Home / Self Care | Payer: 59

## 2016-06-05 ENCOUNTER — Encounter (HOSPITAL_COMMUNITY): Payer: Self-pay

## 2016-06-05 ENCOUNTER — Ambulatory Visit (HOSPITAL_COMMUNITY)
Admission: EM | Admit: 2016-06-05 | Discharge: 2016-06-05 | Disposition: A | Discharge status: Nursing Home (Includes ICF) | Payer: 59 | Attending: Family Medicine | Admitting: Family Medicine

## 2016-06-05 ENCOUNTER — Emergency Department (HOSPITAL_COMMUNITY): Payer: 59

## 2016-06-05 ENCOUNTER — Emergency Department (HOSPITAL_COMMUNITY)
Admission: EM | Admit: 2016-06-05 | Discharge: 2016-06-06 | Disposition: A | Discharge status: Home / Self Care | Payer: 59 | Attending: Emergency Medicine | Admitting: Emergency Medicine

## 2016-06-05 ENCOUNTER — Other Ambulatory Visit: Payer: Self-pay

## 2016-06-05 DIAGNOSIS — Z79899 Other long term (current) drug therapy: Secondary | ICD-10-CM | POA: Insufficient documentation

## 2016-06-05 DIAGNOSIS — M79609 Pain in unspecified limb: Secondary | ICD-10-CM

## 2016-06-05 DIAGNOSIS — M25462 Effusion, left knee: Secondary | ICD-10-CM | POA: Diagnosis not present

## 2016-06-05 DIAGNOSIS — M25562 Pain in left knee: Secondary | ICD-10-CM | POA: Diagnosis not present

## 2016-06-05 HISTORY — DX: Abnormal electrocardiogram (ECG) (EKG): R94.31

## 2016-06-05 HISTORY — DX: Other specified postprocedural states: Z98.890

## 2016-06-05 LAB — CBC WITH DIFFERENTIAL/PLATELET
Basophils Absolute: 0 10*3/uL (ref 0.0–0.1)
Basophils Relative: 0 %
Eosinophils Absolute: 0 10*3/uL (ref 0.0–0.7)
Eosinophils Relative: 0 %
HCT: 43.3 % (ref 39.0–52.0)
HEMOGLOBIN: 14.7 g/dL (ref 13.0–17.0)
LYMPHS ABS: 1.5 10*3/uL (ref 0.7–4.0)
Lymphocytes Relative: 11 %
MCH: 28.4 pg (ref 26.0–34.0)
MCHC: 33.9 g/dL (ref 30.0–36.0)
MCV: 83.8 fL (ref 78.0–100.0)
Monocytes Absolute: 0.6 10*3/uL (ref 0.1–1.0)
Monocytes Relative: 4 %
NEUTROS ABS: 11.9 10*3/uL — AB (ref 1.7–7.7)
NEUTROS PCT: 85 %
PLATELETS: 247 10*3/uL (ref 150–400)
RBC: 5.17 MIL/uL (ref 4.22–5.81)
RDW: 13.4 % (ref 11.5–15.5)
WBC: 14 10*3/uL — ABNORMAL HIGH (ref 4.0–10.5)

## 2016-06-05 LAB — COMPREHENSIVE METABOLIC PANEL
ALT: 42 U/L (ref 17–63)
AST: 35 U/L (ref 15–41)
Albumin: 4.1 g/dL (ref 3.5–5.0)
Alkaline Phosphatase: 99 U/L (ref 38–126)
Anion gap: 13 (ref 5–15)
BUN: 14 mg/dL (ref 6–20)
CO2: 23 mmol/L (ref 22–32)
Calcium: 9.6 mg/dL (ref 8.9–10.3)
Chloride: 99 mmol/L — ABNORMAL LOW (ref 101–111)
Creatinine, Ser: 1.18 mg/dL (ref 0.61–1.24)
Glucose, Bld: 145 mg/dL — ABNORMAL HIGH (ref 65–99)
POTASSIUM: 4.1 mmol/L (ref 3.5–5.1)
Sodium: 135 mmol/L (ref 135–145)
Total Bilirubin: 0.7 mg/dL (ref 0.3–1.2)
Total Protein: 7.6 g/dL (ref 6.5–8.1)

## 2016-06-05 LAB — URINALYSIS, ROUTINE W REFLEX MICROSCOPIC
Bilirubin Urine: NEGATIVE
Glucose, UA: NEGATIVE mg/dL
Hgb urine dipstick: NEGATIVE
Ketones, ur: NEGATIVE mg/dL
Leukocytes, UA: NEGATIVE
NITRITE: NEGATIVE
Protein, ur: NEGATIVE mg/dL
Specific Gravity, Urine: 1.017 (ref 1.005–1.030)
pH: 6 (ref 5.0–8.0)

## 2016-06-05 LAB — RAPID URINE DRUG SCREEN, HOSP PERFORMED
Amphetamines: NOT DETECTED
Barbiturates: NOT DETECTED
Benzodiazepines: NOT DETECTED
Cocaine: NOT DETECTED
Opiates: NOT DETECTED
Tetrahydrocannabinol: NOT DETECTED

## 2016-06-05 LAB — CBG MONITORING, ED: Glucose-Capillary: 135 mg/dL — ABNORMAL HIGH (ref 65–99)

## 2016-06-05 LAB — ETHANOL: Alcohol, Ethyl (B): <5 mg/dL (ref <5)

## 2016-06-05 MED ORDER — KETOROLAC TROMETHAMINE 30 MG/ML IJ SOLN
30.0000 mg | Freq: Once | INTRAMUSCULAR | Status: AC
  Administered 2016-06-05: 30 mg via INTRAVENOUS
  Filled 2016-06-05: qty 1

## 2016-06-05 MED ORDER — KETOROLAC TROMETHAMINE 60 MG/2ML IM SOLN: 60.0000 mg | Freq: Once | INTRAMUSCULAR | Status: DC

## 2016-06-05 MED ORDER — SODIUM CHLORIDE 0.9 % IV BOLUS (SEPSIS)
1000.0000 mL | Freq: Once | INTRAVENOUS | Status: AC
  Administered 2016-06-05: 1000 mL via INTRAVENOUS

## 2016-06-05 MED ORDER — KETOROLAC TROMETHAMINE 60 MG/2ML IM SOLN
INTRAMUSCULAR | Status: AC
  Filled 2016-06-05: qty 2

## 2016-06-05 NOTE — ED Notes (Signed)
Patient voluntarily moved left leg while I was obtaining vital signs.  NAD at this time.

## 2016-06-05 NOTE — ED Notes (Signed)
Patient refused Toradol injection...MD advised.

## 2016-06-05 NOTE — ED Provider Notes (Addendum)
[facility]    CSN: [id number] Arrival date & time: 06/05/16  1401     History   Chief Complaint Chief Complaint  Patient presents with  . Leg Pain    HPI Jeffrey Roach is a 45 y.o. male.   Is a 45 year old gentleman who underwent spinal fusion L5-S1 8 weeks ago. He's been gradually covering from this using oxycodone for pain. This morning 5:00 he woke up with acute pain behind his left knee. He says that his knee is "locked". Anytime he tries to move the knee, he has excruciating pain. At the same time, he can push all around the knee and has no pain. He's had no chest pain.  Since 5:00, patient took an oxycodone and slept. When he arrived here, he was somewhat lethargic and obtunded but cries out in pain whenever the knee is moved.  He called his doctor who referred him to the urgent care.      Past Medical History:  Diagnosis Date  . Hypercholesteremia   . Hypotestosteronemia     There are no active problems to display for this patient.   History reviewed. No pertinent surgical history.     Home Medications    Prior to Admission medications   Medication Sig Start Date End Date Taking? Authorizing Provider  Cholecalciferol (VITAMIN D3) 10000 UNITS capsule Take 10,000 Units by mouth daily.    Historical Provider, MD  HYDROcodone-homatropine (HYCODAN) 5-1.5 MG/5ML syrup Take 5 mLs by mouth every 6 (six) hours as needed for cough. 08/21/14   Rodolph BongEvan S Corey, MD  OMEGA-3 KRILL OIL 300 MG CAPS Take 300 mg by mouth daily.    Historical Provider, MD  ondansetron (ZOFRAN) 4 MG tablet Take 1 tablet (4 mg total) by mouth every 6 (six) hours. 01/02/13   Toney SangJerrid Pippin, MD  oxyCODONE-acetaminophen (PERCOCET) 5-325 MG per tablet Take 1-2 tablets by mouth every 4 (four) hours as needed. 04/29/14   Rolan BuccoMelanie Belfi, MD  oxyCODONE-acetaminophen (PERCOCET/ROXICET) 5-325 MG per tablet Take 2 tablets by mouth every 4 (four) hours as needed for pain. Take 1-2 tablets by mouth  every 4 (four) hours as needed for pain. 01/02/13   Toney SangJerrid Pippin, MD  testosterone cypionate (DEPOTESTOTERONE CYPIONATE) 200 MG/ML injection Inject 200 mg into the muscle every 14 (fourteen) days.    Historical Provider, MD    Family History History reviewed. No pertinent family history.  Social History Social History  Substance Use Topics  . Smoking status: Never Smoker  . Smokeless tobacco: Current User  . Alcohol use 1.2 oz/week    2 Cans of beer per week     Allergies   Aspirin   Review of Systems Review of Systems  Constitutional: Negative.   Respiratory: Negative.   Cardiovascular: Negative.   Musculoskeletal: Positive for arthralgias.     Physical Exam Triage Vital Signs ED Triage Vitals  Enc Vitals Group     BP 06/05/16 1514 135/85     Pulse Rate 06/05/16 1514 115     Resp 06/05/16 1514 26     Temp 06/05/16 1514 97.7 F (36.5 C)     Temp Source 06/05/16 1514 Oral     SpO2 06/05/16 1514 98 %     Weight --      Height --      Head Circumference --      Peak Flow --      Pain Score 06/05/16 1507 10     Pain Loc --  Pain Edu? --      Excl. in GC? --    No data found.   Updated Vital Signs BP 135/85 (BP Location: Left Arm)   Pulse 115   Temp 97.7 F (36.5 C) (Oral)   Resp 26   SpO2 98%   Physical Exam  Constitutional: He is oriented to person, place, and time. He appears well-developed and well-nourished.  HENT:  Head: Normocephalic.  Right Ear: External ear normal.  Left Ear: External ear normal.  Mouth/Throat: Oropharynx is clear and moist.  Eyes: Conjunctivae and EOM are normal. Pupils are equal, round, and reactive to light.  Neck: Normal range of motion. Neck supple.  Musculoskeletal:  Patient is unwilling to bend his left knee at all. Palpation of the knee and lower extremity reveals no tenderness. There is no swelling of the knee.  Neurological: He is alert and oriented to person, place, and time.  Skin: Skin is warm. He is  diaphoretic.  Nursing note and vitals reviewed.    UC Treatments / Results  Labs (all labs ordered are listed, but only abnormal results are displayed) Labs Reviewed - No data to display  EKG  EKG Interpretation None       Radiology No results found.  Procedures Procedures (including critical care time)  Medications Ordered in UC Medications  ketorolac (TORADOL) injection 60 mg (not administered)     Initial Impression / Assessment and Plan / UC Course  I have reviewed the triage vital signs and the nursing notes.  Pertinent labs & imaging results that were available during my care of the patient were reviewed by me and considered in my medical decision making (see chart for details).  Clinical Course  Patient refused ketorolac IM. He screams in pain whenever he is moved. There is really very little I can do to relieve his pain or evaluate him. Therefore I'm sending him to the emergency department to be seen and further evaluated by orthopedics.  Final Clinical Impressions(s) / UC Diagnoses   Final diagnoses:  Acute pain of left knee    New Prescriptions Current Discharge Medication List       Elvina SidleKurt Kolbie Clarkston, MD 06/05/16 [id number]    Elvina SidleKurt Rahel Carlton, MD 06/05/16 1530

## 2016-06-05 NOTE — ED Notes (Signed)
Patient transported to X-ray 

## 2016-06-05 NOTE — ED Provider Notes (Signed)
[facility]-EMERGENCY DEPT Provider Note   CSN: [id number] Arrival date & time: 06/05/16  1548  By signing my name below, I, Phillis Haggis, attest that this documentation has been prepared under the direction and in the presence of Julann Mcgilvray, PA-C. Electronically Signed: Phillis Haggis, ED Scribe. 06/05/16. 6:33 PM.  History   Chief Complaint Chief Complaint  Patient presents with  . Knee Pain   The history is provided by the spouse. No language interpreter was used.   Level V caveat initially due to patient's mental status.  HPI Comments: TOM MACPHERSON is a 45 y.o. male who presents to the Emergency Department complaining of sudden onset posterior left knee pain onset this morning around 5 AM. Wife says that he woke her up, screaming that his knee was hurting and unable to bend his knee. He has associated swelling and nausea secondary to pain with movement. He was seen at Urgent Care today and sent to the ED for further evaluation. Pt is s/p minimally invasive L5-S1 spinal fusion 03/21/2016. Pt took 10 mg of Oxycodone at 6 AM today. Pt has also used Capsaicin cream on the area to no relief. Patient's wife notes that the patient has difficulty ambulating at baseline due to his back surgery.   Wife says that pt was coherent at noon, but currently patient is incoherent and lethargic. She says that he does not typically take pain medication because he has had some "loopiness" in the past with the medications, but never for this long. She denies hx of liver or kidney problems, injury or fall, fever, vomiting, color change, wounds, or any other complaints. Pt is allergic to aspirin.       Past Medical History:  Diagnosis Date  . Hypercholesteremia   . Hypotestosteronemia   . Previous back surgery 03-2017  . Prolonged Q-T interval on ECG     There are no active problems to display for this patient.   Past Surgical History:  Procedure Laterality Date  . BACK SURGERY N/A 03/2016      Home Medications    Prior to Admission medications   Medication Sig Start Date End Date Taking? Authorizing Provider  Cholecalciferol (VITAMIN D3) 10000 UNITS capsule Take 10,000 Units by mouth daily.    Historical Provider, MD  HYDROcodone-homatropine (HYCODAN) 5-1.5 MG/5ML syrup Take 5 mLs by mouth every 6 (six) hours as needed for cough. 08/21/14   Rodolph Bong, MD  OMEGA-3 KRILL OIL 300 MG CAPS Take 300 mg by mouth daily.    Historical Provider, MD  ondansetron (ZOFRAN) 4 MG tablet Take 1 tablet (4 mg total) by mouth every 6 (six) hours. 01/02/13   Toney Sang, MD  oxyCODONE-acetaminophen (PERCOCET) 5-325 MG per tablet Take 1-2 tablets by mouth every 4 (four) hours as needed. 04/29/14   Rolan Bucco, MD  oxyCODONE-acetaminophen (PERCOCET/ROXICET) 5-325 MG per tablet Take 2 tablets by mouth every 4 (four) hours as needed for pain. Take 1-2 tablets by mouth every 4 (four) hours as needed for pain. 01/02/13   Toney Sang, MD  testosterone cypionate (DEPOTESTOTERONE CYPIONATE) 200 MG/ML injection Inject 200 mg into the muscle every 14 (fourteen) days.    Historical Provider, MD    Family History No family history on file.  Social History Social History  Substance Use Topics  . Smoking status: Never Smoker  . Smokeless tobacco: Current User  . Alcohol use 1.2 oz/week    2 Cans of beer per week    Allergies   Aspirin  Review of Systems Review of Systems  Unable to perform ROS: Mental status change  Constitutional: Negative for fever.  Gastrointestinal: Positive for nausea. Negative for vomiting.  Musculoskeletal: Positive for arthralgias and joint swelling.  Skin: Negative for color change and wound.  All other systems reviewed and are negative.  Physical Exam Updated Vital Signs BP 127/100 (BP Location: Right Arm)   Pulse 106   Temp 98.1 F (36.7 C) (Oral)   Resp 18   Ht 5\' 11"  (1.803 m)   Wt 200 lb (90.7 kg)   SpO2 95%   BMI 27.89 kg/m   Physical Exam   Constitutional: He appears well-developed and well-nourished. No distress.  HENT:  Head: Normocephalic and atraumatic.  Eyes: Conjunctivae are normal.  Neck: Neck supple.  Cardiovascular: Normal rate, regular rhythm, normal heart sounds and intact distal pulses.   Pulmonary/Chest: Effort normal and breath sounds normal. No respiratory distress.  Abdominal: Soft. There is no tenderness. There is no guarding.  Musculoskeletal: He exhibits tenderness. He exhibits no edema.       Left knee: He exhibits swelling.  Tenderness to left posterior knee with some associated swelling. Distal pulses intact and equal bilaterally. Patient will not follow commands to do range of motion of the left knee. Screams out in pain when passive range of motion is attempted. Full ROM in right LE.    No increased warmth or erythema. No crepitus, effusion, or deformity.  Lymphadenopathy:    He has no cervical adenopathy.  Neurological:  Patient appears to be somnolent at best. At times obtunded. When patient was told to open his eyes, they partially flicker open. Patient only partially follows commands. Patient will sometimes respond and answer questions and other times there is no response. Patient will yell and cry when his left lower extremity is manipulated.  Skin: Skin is warm and dry. He is not diaphoretic.  Psychiatric: He has a normal mood and affect. His behavior is normal.  Nursing note and vitals reviewed.  ED Treatments / Results  DIAGNOSTIC STUDIES: Oxygen Saturation is 95% on RA, adequate by my interpretation.    COORDINATION OF CARE: 6:33 PM-Discussed treatment plan which includes x-ray, labs, and IV with pt at bedside and pt agreed to plan.    Labs (all labs ordered are listed, but only abnormal results are displayed) Labs Reviewed  COMPREHENSIVE METABOLIC PANEL - Abnormal; Notable for the following:       Result Value   Chloride 99 (*)    Glucose, Bld 145 (*)    All other components  within normal limits  CBC WITH DIFFERENTIAL/PLATELET - Abnormal; Notable for the following:    WBC 14.0 (*)    Neutro Abs 11.9 (*)    All other components within normal limits  CBG MONITORING, ED - Abnormal; Notable for the following:    Glucose-Capillary 135 (*)    All other components within normal limits  ETHANOL  RAPID URINE DRUG SCREEN, HOSP PERFORMED  URINALYSIS, ROUTINE W REFLEX MICROSCOPIC (NOT [facility])    EKG  EKG Interpretation  Date/Time:  Monday June 05 2016 19:09:14 EDT Ventricular Rate:  116 PR Interval:    QRS Duration: 90 QT Interval:  450 QTC Calculation: 625 R Axis:   58 Text Interpretation:  Critical Test Result: Long QTc Sinus tachycardia Prolonged QT Abnormal ECG Confirmed by ZAVITZ MD, JOSHUA [id number]) on 06/06/2016 11:36:43 AM       Radiology Ct Knee Left Wo Contrast  Result Date: 06/05/2016 CLINICAL DATA:  Acute onset of left knee pain.  Initial encounter. EXAM: CT OF THE LEFT KNEE WITHOUT CONTRAST TECHNIQUE: Multidetector CT imaging of the left knee was performed according to the standard protocol. Multiplanar CT image reconstructions were also generated. COMPARISON:  Left knee radiographs performed earlier today at 5:30 p.m. FINDINGS: Bones/Joint/Cartilage There is no evidence of fracture or dislocation. Visualized joint spaces are preserved. The cartilage is difficult to fully assess on CT. The menisci are not well characterized. A moderate knee joint effusion is noted. A small osseous density at the proximal aspect of the posterior cruciate ligament may reflect remote posterior cruciate ligament injury, though it appears grossly intact at this time. Ligaments The posterior cruciate ligament appears grossly intact. The anterior cruciate ligament is also unremarkable in appearance. The medial collateral ligament and lateral collateral ligament complex are grossly unremarkable in appearance. Muscles and Tendons Visualized musculature is grossly unremarkable.  The quadriceps and patellar tendons remain grossly intact. Soft tissues The vasculature is not well assessed without contrast. A fabella is incidentally noted. No significant soft tissue injury is characterized on CT. IMPRESSION: 1. No evidence of fracture or dislocation. 2. Moderate knee joint effusion noted. If the patient's symptoms persist, MRI could be considered for further evaluation, to assess for underlying internal derangement. 3. Small osseous density at the proximal aspect of the posterior cruciate ligament may reflect remote posterior cruciate ligament injury, though it appears grossly intact at this time. Electronically Signed   By: Roanna Raider M.D.   On: 06/05/2016 22:30   Dg Knee Complete 4 Views Left  Result Date: 06/05/2016 CLINICAL DATA:  Left knee pain. Severe posterior knee pain x this am, no known injury, woke up this am and was in EXCRUCIATING pain, pt shielded EXAM: LEFT KNEE - COMPLETE 4+ VIEW COMPARISON:  None. FINDINGS: No acute fracture or dislocation. Enthesophyte at the patellar tendon origin. No joint effusion. Joint spaces maintained. Lateral view minimally motion degraded. IMPRESSION: No acute osseous abnormality. Electronically Signed   By: Jeronimo Greaves M.D.   On: 06/05/2016 18:14    Procedures Procedures (including critical care time)  Medications Ordered in ED Medications  sodium chloride 0.9 % bolus 1,000 mL (0 mLs Intravenous Stopped 06/05/16 2031)  ketorolac (TORADOL) 30 MG/ML injection 30 mg (30 mg Intravenous Given 06/05/16 2031)     Initial Impression / Assessment and Plan / ED Course  I have reviewed the triage vital signs and the nursing notes.  Pertinent labs & imaging results that were available during my care of the patient were reviewed by me and considered in my medical decision making (see chart for details).  Clinical Course     Patient presents with original complaint of left knee pain with sudden onset this morning. Patient was also  assessed for additional complaint of mental status change. Mental status change is most likely due to narcotic use, however, this was investigated further due to the wife's statements that the patient's current behavior is unusual for him, even with the use of narcotics. No respiratory depression or other indication for narcan. My suspicion for septic joint is low based on exam and history. No DVT on [place]. Prolonged QT interval noted on EKG, new when compared to last available tracing from May 2014. This finding was discussed with the patient as well as the necessary precautions that need to be taken. Follow-up EKG with PCP. Patient was monitored closely. Upon reassessment, patient's mental status improved to normal following IV fluids and some time. Pt will  now allow manipulation of his knee and lower leg. Voices pain with flexion. Orthopedic follow-up. Strict return precautions discussed. Patient and patient's wife voice understanding of all instructions and are comfortable with discharge.   Findings and plan of care discussed with Gerhard Munchobert Lockwood, MD. Dr. Jeraldine LootsLockwood personally evaluated and examined this patient.   Vitals:   06/05/16 1655 06/05/16 1657 06/05/16 1859 06/05/16 2257  BP: 127/100  139/90 116/83  Pulse: 106  112 92  Resp: 18  20 18   Temp: 98.1 F (36.7 C)   98.4 F (36.9 C)  TempSrc: Oral   Oral  SpO2: 95%  98% 97%  Weight:  90.7 kg    Height:  5\' 11"  (1.803 m)       Final Clinical Impressions(s) / ED Diagnoses   Final diagnoses:  Acute pain of left knee   New Prescriptions Discharge Medication List as of 06/05/2016 11:03 PM     I personally performed the services described in this documentation, which was scribed in my presence. The recorded information has been reviewed and is accurate.    Anselm PancoastShawn C Ajdin Macke, PA-C 06/06/16 1434    Gerhard Munchobert Lockwood, MD 06/08/16 [phone]

## 2016-06-05 NOTE — ED Notes (Signed)
CBG 135 mg/dL reported to Dole FoodShawn Joy PA

## 2016-06-05 NOTE — ED Triage Notes (Signed)
Patient states that he woke up in the middle of the night with extreme pain to his Left Knee.

## 2016-06-05 NOTE — Discharge Instructions (Signed)
There were no significant abnormalities noted on your imaging. There was no blood clot on ultrasound. Use the ACE wrap for support. Follow up with orthopedics. Use caution with narcotic pain medications as you seem to react strongly to them. Return to the ED as needed.

## 2016-06-05 NOTE — Progress Notes (Signed)
*  PRELIMINARY RESULTS* Vascular Ultrasound Left lower extremity venous duplex has been completed.  Preliminary findings: No evidence of DVT or baker's cyst.   Farrel DemarkJill Eunice, RDMS, RVT  06/05/2016, 6:57 PM

## 2016-06-05 NOTE — ED Triage Notes (Signed)
[facility] side . Pt had surgery 03/2016.

## 2016-06-05 NOTE — ED Triage Notes (Signed)
Pt woke up with left knee pain. Pt denies injury or surgery. Pt reports inability to fully ben without extreme pain. NO swelling or redness noted. Pulses intact

## 2016-06-05 NOTE — ED Triage Notes (Signed)
PT unable to to move Lt leg with out screaming . Pt family reprts he is not acting as alert as earlier this Am. Iv place ,CBG checked .

## 2016-06-05 NOTE — ED Notes (Signed)
Patient returned from x ray.  Patient crying and coughing as if he was going to vomit.  Patient given emesis bag.

## 2016-06-07 DIAGNOSIS — Z09 Encounter for follow-up examination after completed treatment for conditions other than malignant neoplasm: Secondary | ICD-10-CM | POA: Diagnosis not present

## 2016-06-07 DIAGNOSIS — M25562 Pain in left knee: Secondary | ICD-10-CM | POA: Diagnosis not present

## 2016-06-07 DIAGNOSIS — R739 Hyperglycemia, unspecified: Secondary | ICD-10-CM | POA: Diagnosis not present

## 2016-06-07 DIAGNOSIS — K047 Periapical abscess without sinus: Secondary | ICD-10-CM | POA: Diagnosis not present

## 2016-06-07 DIAGNOSIS — R9431 Abnormal electrocardiogram [ECG] [EKG]: Secondary | ICD-10-CM | POA: Diagnosis not present

## 2016-06-07 DIAGNOSIS — M25561 Pain in right knee: Secondary | ICD-10-CM | POA: Diagnosis not present

## 2016-06-14 DIAGNOSIS — M25562 Pain in left knee: Secondary | ICD-10-CM | POA: Diagnosis not present

## 2017-03-07 DIAGNOSIS — Z9889 Other specified postprocedural states: Secondary | ICD-10-CM

## 2017-03-07 HISTORY — DX: Other specified postprocedural states: Z98.890

## 2017-08-09 ENCOUNTER — Ambulatory Visit: Payer: 59 | Admitting: Family Medicine

## 2017-08-09 ENCOUNTER — Encounter: Payer: Self-pay | Admitting: Family Medicine

## 2017-08-09 DIAGNOSIS — R5383 Other fatigue: Secondary | ICD-10-CM

## 2017-08-09 DIAGNOSIS — F339 Major depressive disorder, recurrent, unspecified: Secondary | ICD-10-CM | POA: Diagnosis not present

## 2017-08-09 LAB — COMPREHENSIVE METABOLIC PANEL
ALBUMIN: 4.5 g/dL (ref 3.5–5.2)
ALT: 24 U/L (ref 0–53)
AST: 17 U/L (ref 0–37)
Alkaline Phosphatase: 86 U/L (ref 39–117)
BUN: 20 mg/dL (ref 6–23)
CO2: 29 mEq/L (ref 19–32)
Calcium: 9.5 mg/dL (ref 8.4–10.5)
Chloride: 102 mEq/L (ref 96–112)
Creatinine, Ser: 0.97 mg/dL (ref 0.40–1.50)
GFR: 88.75 mL/min (ref >60.00)
GLUCOSE: 78 mg/dL (ref 70–99)
Potassium: 4.2 mEq/L (ref 3.5–5.1)
SODIUM: 142 meq/L (ref 135–145)
TOTAL PROTEIN: 7.5 g/dL (ref 6.0–8.3)
Total Bilirubin: 0.5 mg/dL (ref 0.2–1.2)

## 2017-08-09 LAB — CBC
HCT: 47.8 % (ref 39.0–52.0)
HEMOGLOBIN: 15.8 g/dL (ref 13.0–17.0)
MCHC: 33.1 g/dL (ref 30.0–36.0)
MCV: 87.8 fl (ref 78.0–100.0)
Platelets: 224 10*3/uL (ref 150.0–400.0)
RBC: 5.44 Mil/uL (ref 4.22–5.81)
RDW: 13.4 % (ref 11.5–15.5)
WBC: 6.4 10*3/uL (ref 4.0–10.5)

## 2017-08-09 LAB — LIPID PANEL
CHOLESTEROL: 260 mg/dL — AB (ref 0–200)
HDL: 36.7 mg/dL — ABNORMAL LOW (ref >39.00)
Total CHOL/HDL Ratio: 7
Triglycerides: 693 mg/dL — ABNORMAL HIGH (ref 0.0–149.0)

## 2017-08-09 LAB — TSH: TSH: 4.41 u[IU]/mL (ref 0.35–4.50)

## 2017-08-09 LAB — VITAMIN B12: Vitamin B-12: 447 pg/mL (ref 211–911)

## 2017-08-09 LAB — LDL CHOLESTEROL, DIRECT: Direct LDL: 148 mg/dL

## 2017-08-09 MED ORDER — BUPROPION HCL ER (XL) 150 MG PO TB24: 150.0000 mg | ORAL_TABLET | Freq: Every day | ORAL | 3 refills | Status: DC

## 2017-08-09 NOTE — Progress Notes (Signed)
Subjective:   Patient ID: Jeffrey Roach, male    DOB: [birth year], 47 y.o.   MRN: [id number]  Jeffrey GladdenBobby J Roach is a pleasant 47 y.o. year old male who presents to clinic today with Establish Care (Patient is here today to establish care.) and Depression (Patient would like to discus his depression.  Plz see Questionnaire.)  on 08/09/2017  HPI:  Depression- has been worsening since his back injury in 2015.  Most recent back surgery in 03/2017. Followed by Dr. Noel Geroldohen, neurosurgery. Was placed on Cymbalta 30 mg twice daily in hopes of helping with the pain and depression.  He thinks it has helped some with the pain but he still has a last of sadness and anhedonia.  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  He ends up sitting around all day when he used to be active.  Denies SI or HI.  Sleeps okay but he feels his nightly flexeril for his back spasms helps with his insomnia.  Has not been on any other antidepressants in past.  Current Outpatient Medications on File Prior to Visit  Medication Sig Dispense Refill  . cyclobenzaprine (FLEXERIL) 10 MG tablet Take 10 mg by mouth 3 (three) times daily as needed.    . DULoxetine (CYMBALTA) 30 MG capsule Take 30 mg by mouth 2 (two) times daily.     No current facility-administered medications on file prior to visit.     Allergies  Allergen Reactions  . Aspirin Other (See Comments)    Due to increase bleeding    Past Medical History:  Diagnosis Date  . Hypercholesteremia   . Hypotestosteronemia   . Previous back surgery 03-2017  . Prolonged Q-T interval on ECG     Past Surgical History:  Procedure Laterality Date  . BACK SURGERY N/A 03/2016    No family history on file.  Social History   Socioeconomic History  . Marital status: Married    Spouse name: Not on file  . Number of children: Not on file  . Years of education: Not on file  . Highest education level: Not on file  Social  Needs  . Financial resource strain: Not on file  . Food insecurity - worry: Not on file  . Food insecurity - inability: Not on file  . Transportation needs - medical: Not on file  . Transportation needs - non-medical: Not on file  Occupational History  . Not on file  Tobacco Use  . Smoking status: Never Smoker  . Smokeless tobacco: Current User  Substance and Sexual Activity  . Alcohol use: Yes    Alcohol/week: 1.2 oz    Types: 2 Cans of beer per week  . Drug use: No  . Sexual activity: Not on file  Other Topics Concern  . Not on file  Social History Narrative  . Not on file   The PMH, PSH, Social History, Family History, Medications, and allergies [facility], and have been updated if relevant.   Review of Systems  Genitourinary: Negative.   Musculoskeletal: Positive for back pain.  Neurological: Positive for numbness.  Psychiatric/Behavioral: Positive for dysphoric mood. Negative for agitation, behavioral problems, confusion, decreased concentration, hallucinations, self-injury, sleep disturbance and suicidal ideas. The patient is not nervous/anxious and is not hyperactive.   All other systems reviewed and are negative.      Objective:    BP 132/80 (BP Location: Left Arm, Patient Position:  Sitting, Cuff Size: Normal)   Pulse 76   Temp 98.6 F (37 C) (Oral)   Ht 5\' 1"  (1.549 m)   Wt 199 lb 14.4 oz (90.7 kg)   SpO2 98%   BMI 37.77 kg/m    Physical Exam  Constitutional: He is oriented to person, place, and time. He appears well-developed and well-nourished. No distress.  HENT:  Head: Normocephalic and atraumatic.  Eyes: Conjunctivae are normal.  Cardiovascular: Normal rate and regular rhythm.  Pulmonary/Chest: Effort normal and breath sounds normal.  Musculoskeletal: Normal range of motion. He exhibits no edema.  Neurological: He is alert and oriented to person, place, and time. No cranial nerve deficit.  Skin: Skin is warm and dry. He is not  diaphoretic.  Psychiatric: He has a normal mood and affect. His behavior is normal. Judgment and thought content normal.  Nursing note reviewed.         Assessment & Plan:   Other fatigue - Plan: B12, TSH, CBC, Comprehensive metabolic panel, Lipid panel No Follow-up on file.

## 2017-08-09 NOTE — Patient Instructions (Signed)
Great to meet you. We are starting Wellbutrin 150 mg XL daily. Okay to continue Cymbalta for now.  Please come see me in 1 month.

## 2017-08-09 NOTE — Assessment & Plan Note (Signed)
>  25 minutes spent in face to face time with patient, >50% spent in counselling or coordination of care.  Will add Wellbutrin 150 mg XL daily.  He is not sure yet if he wants to wean off Cymbalta.  Discussed this with his wife as well who is a Engineer, civil (consulting)nurse. He has been fatigued, will draw labs today as well.

## 2017-08-10 ENCOUNTER — Encounter: Payer: Self-pay | Admitting: Physician Assistant

## 2017-08-10 NOTE — Progress Notes (Signed)
error 

## 2017-08-14 ENCOUNTER — Telehealth: Payer: Self-pay

## 2017-08-14 DIAGNOSIS — R5383 Other fatigue: Secondary | ICD-10-CM

## 2017-08-14 DIAGNOSIS — E785 Hyperlipidemia, unspecified: Secondary | ICD-10-CM

## 2017-08-14 DIAGNOSIS — F339 Major depressive disorder, recurrent, unspecified: Secondary | ICD-10-CM

## 2017-08-14 MED ORDER — FENOFIBRATE 145 MG PO TABS: 145.0000 mg | ORAL_TABLET | Freq: Every day | ORAL | 1 refills | Status: DC

## 2017-08-14 NOTE — Telephone Encounter (Signed)
TA-Pt wife asked if there is a correlation between low testosterone and elevated cholesterol? They were told several years back when his levels were low that his cholesterol could be high because of that/Plz advise/thx dmf

## 2017-08-14 NOTE — Telephone Encounter (Signed)
It can be impacted but triglycerides are quite high and need to be brought down regardless as elevated triglycerides can lead to pancreatitis.

## 2017-08-14 NOTE — Telephone Encounter (Signed)
-----   Message from Dianne Dunalia M Aron, MD sent at 08/13/2017  9:27 AM EST ----- If he was fasting, we do need to start him on triglyeride rx like Tricor.   Please let me know if he is okay with this and I will send rx to his pharmacy and we should repeat a fasting lipid panel and CMET in 8 weeks.  Also, decrease added sugars, eliminate trans fats, increase fiber and limit alcohol.  All these changes together can drop triglycerides by almost 50%.

## 2017-08-14 NOTE — Telephone Encounter (Signed)
Tricor eRx sent to pharmacy on file.

## 2017-08-14 NOTE — Telephone Encounter (Signed)
Pt aware/thx dmf 

## 2017-08-14 NOTE — Telephone Encounter (Signed)
TA-Pt agrees to start Tricor/what dosage would you like for me to send in? Plz advise/thx dmf  Future orders created for Lipid & CMP per TA

## 2017-08-15 ENCOUNTER — Other Ambulatory Visit (INDEPENDENT_AMBULATORY_CARE_PROVIDER_SITE_OTHER): Payer: 59

## 2017-08-15 DIAGNOSIS — R5383 Other fatigue: Secondary | ICD-10-CM | POA: Diagnosis not present

## 2017-08-15 LAB — TESTOSTERONE: TESTOSTERONE: 302.24 ng/dL (ref 300.00–890.00)

## 2017-08-23 DIAGNOSIS — K640 First degree hemorrhoids: Secondary | ICD-10-CM | POA: Diagnosis not present

## 2017-08-23 DIAGNOSIS — Z86010 Personal history of colonic polyps: Secondary | ICD-10-CM | POA: Diagnosis not present

## 2017-09-10 ENCOUNTER — Encounter: Payer: Self-pay | Admitting: Family Medicine

## 2017-09-10 ENCOUNTER — Other Ambulatory Visit: Payer: Self-pay | Admitting: Family Medicine

## 2017-09-10 ENCOUNTER — Ambulatory Visit: Payer: 59 | Admitting: Family Medicine

## 2017-09-10 VITALS — BP 120/84 | HR 89 | Temp 98.7°F | Ht 71.0 in | Wt 197.6 lb

## 2017-09-10 DIAGNOSIS — E781 Pure hyperglyceridemia: Secondary | ICD-10-CM | POA: Diagnosis not present

## 2017-09-10 DIAGNOSIS — F339 Major depressive disorder, recurrent, unspecified: Secondary | ICD-10-CM | POA: Diagnosis not present

## 2017-09-10 LAB — LIPID PANEL
Cholesterol: 208 mg/dL — ABNORMAL HIGH (ref 0–200)
HDL: 45 mg/dL (ref >39.00)
NONHDL: 162.58
TRIGLYCERIDES: 225 mg/dL — AB (ref 0.0–149.0)
Total CHOL/HDL Ratio: 5
VLDL: 45 mg/dL — ABNORMAL HIGH (ref 0.0–40.0)

## 2017-09-10 LAB — COMPREHENSIVE METABOLIC PANEL
ALBUMIN: 4.5 g/dL (ref 3.5–5.2)
ALK PHOS: 74 U/L (ref 39–117)
ALT: 18 U/L (ref 0–53)
AST: 13 U/L (ref 0–37)
BILIRUBIN TOTAL: 0.4 mg/dL (ref 0.2–1.2)
BUN: 20 mg/dL (ref 6–23)
CO2: 26 mEq/L (ref 19–32)
Calcium: 9.5 mg/dL (ref 8.4–10.5)
Chloride: 103 mEq/L (ref 96–112)
Creatinine, Ser: 1.2 mg/dL (ref 0.40–1.50)
GFR: 69.4 mL/min (ref >60.00)
GLUCOSE: 92 mg/dL (ref 70–99)
Potassium: 4.3 mEq/L (ref 3.5–5.1)
SODIUM: 141 meq/L (ref 135–145)
TOTAL PROTEIN: 7.4 g/dL (ref 6.0–8.3)

## 2017-09-10 LAB — LDL CHOLESTEROL, DIRECT: LDL DIRECT: 137 mg/dL

## 2017-09-10 MED ORDER — BUPROPION HCL ER (XL) 150 MG PO TB24: 150.0000 mg | ORAL_TABLET | Freq: Every day | ORAL | 3 refills | Status: DC

## 2017-09-10 MED ORDER — LINACLOTIDE 290 MCG PO CAPS: 290.0000 ug | ORAL_CAPSULE | Freq: Every day | ORAL | 6 refills | Status: AC

## 2017-09-10 MED ORDER — FENOFIBRATE 145 MG PO TABS: 145.0000 mg | ORAL_TABLET | Freq: Every day | ORAL | 3 refills | Status: DC

## 2017-09-10 MED FILL — buPROPion HCL ER (XL) 150 M: 150 | 30 days supply | Qty: 30 | Fill #0

## 2017-09-10 MED FILL — FENOFIBRATE 145 MG TABLET: 145 | 30 days supply | Qty: 30 | Fill #0

## 2017-09-10 NOTE — Patient Instructions (Signed)
Great to see you. I will call you with your lab results from today and you can view them online.   

## 2017-09-10 NOTE — Assessment & Plan Note (Signed)
Continue Tricor.  Check labs today. The patient indicates understanding of these issues and agrees with the plan.

## 2017-09-10 NOTE — Assessment & Plan Note (Signed)
Symptoms improved on wellbutrin XL 150 mg daily. Reita ClicheBobby wants to continue current dose. eRx refills sent.

## 2017-09-10 NOTE — Progress Notes (Signed)
Subjective:   Patient ID: Jeffrey Roach, male    DOB: [birth year], 47 y.o.   MRN: [id number]  Jeffrey Roach is a pleasant 47 y.o. year old male who presents to clinic today with Follow-up (Patient is here today to F/U with depression.  He states that wife and mother state that they can see a difference and would like to continue it.) and Hyperlipidemia (Patient is also fasting to recheck lipid panel to ensure that the Fenofibrate is effective.)  on 09/10/2017  HPI:  Patient established care with me on 08/09/17 for fatigue and depression. Notes reviewed-  Depression- has been worsening since his back injury in 2015.  Most recent back surgery in 03/2017. Followed by Dr. Noel Gerold, neurosurgery. Was placed on Cymbalta 30 mg twice daily in hopes of helping with the pain and depression.  He thinks it has helped some with the pain but he still has a last of sadness and anhedonia.  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  He ends up sitting around all day when he used to be active.  Denies SI or HI.  Sleeps okay but he feels his nightly flexeril for his back spasms helps with his insomnia.  Has not been on any other antidepressants in past.   Started him on Wellbutrin 150 mg XL daily and advised okay to continue Cymbalta but he was considering weaning off of this. He feels it has been working well at current dose.  His family has noticed a difference. Less fatigue.  More willing to do things.  Hypertriglyceridemia- started him on Tricor 145 mg daily.  Denies any side effects.  Lab Results  Component Value Date   CHOL 260 (H) 08/09/2017   HDL 36.70 (L) 08/09/2017   LDLDIRECT 148.0 08/09/2017   TRIG (H) 08/09/2017    693.0 Triglyceride is over 400; calculations on Lipids are invalid.   CHOLHDL 7 08/09/2017      Current Outpatient Medications on File Prior to Visit  Medication Sig Dispense Refill  . cyclobenzaprine (FLEXERIL) 10 MG  tablet Take 10 mg by mouth 3 (three) times daily as needed.     No current facility-administered medications on file prior to visit.     Allergies  Allergen Reactions  . Aspirin Other (See Comments)    Due to increase bleeding    Past Medical History:  Diagnosis Date  . H/O colonoscopy 2013   Polyps  . Hypercholesteremia   . Hypotestosteronemia   . Previous back surgery 03-2017  . Prolonged Q-T interval on ECG     Past Surgical History:  Procedure Laterality Date  . BACK SURGERY N/A 03/2016   L5-S1 Spinal Fusion  . LAPAROSCOPY  2006   Small Bowel Obstruction    Family History  Problem Relation Age of Onset  . Alcohol abuse Father   . Cancer Father   . COPD Father   . Diabetes Father   . Hypertension Father   . Cancer Maternal Grandfather   . Early death Paternal Grandmother     Social History   Socioeconomic History  . Marital status: Married    Spouse name: Not on file  . Number of children: Not on file  . Years of education: Not on file  . Highest education level: Not on file  Social Needs  . Financial resource strain: Not on file  . Food insecurity - worry: Not on file  . Food insecurity -  inability: Not on file  . Transportation needs - medical: Not on file  . Transportation needs - non-medical: Not on file  Occupational History  . Not on file  Tobacco Use  . Smoking status: Never Smoker  . Smokeless tobacco: Current User  Substance and Sexual Activity  . Alcohol use: Yes    Alcohol/week: 1.2 oz    Types: 2 Cans of beer per week  . Drug use: No  . Sexual activity: Not on file  Other Topics Concern  . Not on file  Social History Narrative  . Not on file   The PMH, PSH, Social History, Family History, Medications, and allergies [facility], and have been updated if relevant.  Review of Systems  Constitutional: Negative.   HENT: Negative.   Respiratory: Negative.   Cardiovascular: Negative.   Gastrointestinal: Negative.     Psychiatric/Behavioral: Negative.   All other systems reviewed and are negative.      Objective:    BP 120/84 (BP Location: Left Arm, Patient Position: Sitting, Cuff Size: Normal)   Pulse 89   Temp 98.7 F (37.1 C) (Oral)   Ht 5\' 11"  (1.803 m)   Wt 197 lb 9.6 oz (89.6 kg)   SpO2 98%   BMI 27.56 kg/m    Physical Exam  Constitutional: He is oriented to person, place, and time. He appears well-developed and well-nourished. No distress.  HENT:  Head: Normocephalic and atraumatic.  Eyes: Conjunctivae and EOM are normal.  Neck: Normal range of motion.  Cardiovascular: Normal rate and regular rhythm.  Pulmonary/Chest: Breath sounds normal.  Musculoskeletal: Normal range of motion.  Neurological: He is alert and oriented to person, place, and time. No cranial nerve deficit.  Skin: Skin is warm and dry. He is not diaphoretic.  Psychiatric: He has a normal mood and affect. His behavior is normal. Judgment and thought content normal.  Nursing note and vitals reviewed.         Assessment & Plan:   Depression, recurrent (HCC)  Hypertriglyceridemia - Plan: Lipid panel, Comprehensive metabolic panel No Follow-up on file.

## 2017-09-12 MED FILL — LINZESS 290 MCG CAPSULE: 290 | 30 days supply | Qty: 30 | Fill #0

## 2017-10-08 MED FILL — buPROPion HCL ER (XL) 150 M: 150 | 30 days supply | Qty: 30 | Fill #1

## 2017-10-08 MED FILL — FENOFIBRATE 145 MG TAB: 145 | 30 days supply | Qty: 30 | Fill #1

## 2017-10-15 ENCOUNTER — Encounter: Payer: Self-pay | Admitting: Family Medicine

## 2017-11-10 ENCOUNTER — Encounter: Payer: Self-pay | Admitting: Family Medicine

## 2017-11-12 MED FILL — buPROPion HCL ER (XL) 150 M: 150 | 30 days supply | Qty: 30 | Fill #2

## 2017-11-12 MED FILL — FENOFIBRATE 145 MG TAB: 145 | 30 days supply | Qty: 30 | Fill #2

## 2017-11-13 ENCOUNTER — Telehealth: Payer: Self-pay

## 2017-11-13 DIAGNOSIS — E781 Pure hyperglyceridemia: Secondary | ICD-10-CM

## 2017-11-13 NOTE — Telephone Encounter (Signed)
Per TA ok for future order for Lipid Panel/sent message stating this to pt/thx dmf

## 2017-11-20 ENCOUNTER — Other Ambulatory Visit (INDEPENDENT_AMBULATORY_CARE_PROVIDER_SITE_OTHER): Payer: 59

## 2017-11-20 DIAGNOSIS — E781 Pure hyperglyceridemia: Secondary | ICD-10-CM

## 2017-11-20 LAB — LIPID PANEL
CHOL/HDL RATIO: 5
Cholesterol: 203 mg/dL — ABNORMAL HIGH (ref 0–200)
HDL: 42.2 mg/dL (ref >39.00)
NONHDL: 160.44
Triglycerides: 228 mg/dL — ABNORMAL HIGH (ref 0.0–149.0)
VLDL: 45.6 mg/dL — ABNORMAL HIGH (ref 0.0–40.0)

## 2017-11-20 LAB — LDL CHOLESTEROL, DIRECT: LDL DIRECT: 142 mg/dL

## 2017-11-21 ENCOUNTER — Encounter: Payer: Self-pay | Admitting: Family Medicine

## 2017-12-12 MED FILL — BUPROPION HCL XL 150 MG TAB: 150 | 30 days supply | Qty: 30 | Fill #3

## 2017-12-12 MED FILL — FENOFIBRATE 145 MG TAB: 145 | 30 days supply | Qty: 30 | Fill #3

## 2017-12-17 ENCOUNTER — Other Ambulatory Visit: Payer: Self-pay

## 2017-12-17 MED ORDER — ROSUVASTATIN CALCIUM 5 MG PO TABS: 5.0000 mg | ORAL_TABLET | Freq: Every day | ORAL | 2 refills | Status: DC

## 2017-12-17 NOTE — Progress Notes (Signed)
Per TA/add Crestor  1qd/sent to pharm/sent MyChart message to pt/thx dmf

## 2018-01-08 ENCOUNTER — Encounter: Payer: Self-pay | Admitting: Family Medicine

## 2018-01-08 ENCOUNTER — Other Ambulatory Visit: Payer: Self-pay

## 2018-01-08 MED ORDER — BUPROPION HCL ER (XL) 150 MG PO TB24: 150.0000 mg | ORAL_TABLET | Freq: Every day | ORAL | 0 refills | Status: DC

## 2018-01-08 MED ORDER — FENOFIBRATE 145 MG PO TABS: 145.0000 mg | ORAL_TABLET | Freq: Every day | ORAL | 0 refills | Status: DC

## 2018-01-08 MED ORDER — ROSUVASTATIN CALCIUM 5 MG PO TABS: 5.0000 mg | ORAL_TABLET | Freq: Every day | ORAL | 0 refills | Status: DC

## 2018-01-08 NOTE — Telephone Encounter (Signed)
Changed rx to 90 day with no refills as pt requested. TLG

## 2018-01-11 MED FILL — BUPROPION HCL XL 150 MG TAB: 150 | 90 days supply | Qty: 90 | Fill #0

## 2018-01-11 MED FILL — ROSUVASTATIN CALCIUM 5 MG T: 5 | 90 days supply | Qty: 90 | Fill #0

## 2018-01-11 MED FILL — FENOFIBRATE 145 MG TAB: 145 | 90 days supply | Qty: 90 | Fill #0

## 2018-03-04 ENCOUNTER — Encounter: Payer: Self-pay | Admitting: Family Medicine

## 2018-03-06 ENCOUNTER — Other Ambulatory Visit: Payer: Self-pay

## 2018-03-06 DIAGNOSIS — R5383 Other fatigue: Secondary | ICD-10-CM

## 2018-03-24 DIAGNOSIS — E7800 Pure hypercholesterolemia, unspecified: Secondary | ICD-10-CM | POA: Diagnosis not present

## 2018-03-24 DIAGNOSIS — R079 Chest pain, unspecified: Secondary | ICD-10-CM | POA: Diagnosis not present

## 2018-03-24 DIAGNOSIS — F419 Anxiety disorder, unspecified: Secondary | ICD-10-CM | POA: Diagnosis not present

## 2018-04-02 ENCOUNTER — Encounter

## 2018-04-02 ENCOUNTER — Other Ambulatory Visit (INDEPENDENT_AMBULATORY_CARE_PROVIDER_SITE_OTHER): Payer: 59

## 2018-04-02 ENCOUNTER — Encounter: Payer: Self-pay | Admitting: Family Medicine

## 2018-04-02 ENCOUNTER — Other Ambulatory Visit: Payer: Self-pay

## 2018-04-02 DIAGNOSIS — R5383 Other fatigue: Secondary | ICD-10-CM

## 2018-04-02 LAB — TESTOSTERONE: TESTOSTERONE: 397.25 ng/dL (ref 300.00–890.00)

## 2018-04-02 MED ORDER — BUPROPION HCL ER (XL) 150 MG PO TB24: 150.0000 mg | ORAL_TABLET | Freq: Every day | ORAL | 0 refills | Status: DC

## 2018-04-02 MED ORDER — FENOFIBRATE 145 MG PO TABS: 145.0000 mg | ORAL_TABLET | Freq: Every day | ORAL | 0 refills | Status: DC

## 2018-04-02 MED ORDER — ROSUVASTATIN CALCIUM 5 MG PO TABS: 5.0000 mg | ORAL_TABLET | Freq: Every day | ORAL | 0 refills | Status: DC

## 2018-04-02 MED FILL — FENOFIBRATE 145 MG TAB: 145 | 90 days supply | Qty: 90 | Fill #0

## 2018-04-02 MED FILL — ROSUVASTATIN CALCIUM 5 MG T: 5 | 90 days supply | Qty: 90 | Fill #0

## 2018-04-02 MED FILL — buPROPion HCL ER (XL) 150 M: 150 | 90 days supply | Qty: 90 | Fill #0

## 2018-04-03 ENCOUNTER — Telehealth: Payer: Self-pay | Admitting: Family Medicine

## 2018-04-03 ENCOUNTER — Encounter: Payer: Self-pay | Admitting: Family Medicine

## 2018-04-03 NOTE — Telephone Encounter (Signed)
This is the wrong office.

## 2018-04-03 NOTE — Telephone Encounter (Signed)
Reviewed lab results and physician's note with patient. Couldn't say exactly how much of the medication he is using at this time.  4 clicks (custom care dosing). Reported his fatigue has improved.

## 2018-04-03 NOTE — Telephone Encounter (Signed)
Routing to PCP [employer].

## 2018-04-04 NOTE — Telephone Encounter (Signed)
TA-Pt called in response/he states that his fatigue is improving on 4 clicks (custom care dosing)/thx dmf

## 2018-04-15 ENCOUNTER — Encounter: Payer: Self-pay | Admitting: Family Medicine

## 2018-04-16 ENCOUNTER — Encounter: Payer: Self-pay | Admitting: Family Medicine

## 2018-04-30 ENCOUNTER — Encounter: Payer: Self-pay | Admitting: Family Medicine

## 2018-05-01 ENCOUNTER — Encounter: Payer: Self-pay | Admitting: Family Medicine

## 2018-05-21 ENCOUNTER — Ambulatory Visit: Payer: Self-pay | Admitting: Family Medicine

## 2018-07-22 ENCOUNTER — Other Ambulatory Visit: Payer: Self-pay | Admitting: Family Medicine

## 2018-07-22 NOTE — Telephone Encounter (Signed)
Copied from CRM [phone]. Topic: Quick Communication - Rx Refill/Question >> Jul 22, 2018  9:26 AM Maye Hidesoley, Sarah wrote: Medication: buPROPion (WELLBUTRIN XL) 150 MG 24 hr tablet 2) rosuvastatin (CRESTOR) 5 MG tablet 3) fenofibrate (TRICOR) 145 MG tablet   Has the patient contacted their pharmacy? yes (Agent: If no, request that the patient contact the pharmacy for the refill.) (Agent: If yes, when and what did the pharmacy advise?)  Preferred Pharmacy (with phone number or street name): [facility] Address: [address], [place], [place] [address]  Agent: Please be advised that RX refills may take up to 3 business days. We ask that you follow-up with your pharmacy.

## 2018-07-23 MED ORDER — FENOFIBRATE 145 MG PO TABS: 145.0000 mg | ORAL_TABLET | Freq: Every day | ORAL | 0 refills | Status: DC

## 2018-07-23 MED ORDER — ROSUVASTATIN CALCIUM 5 MG PO TABS: 5.0000 mg | ORAL_TABLET | Freq: Every day | ORAL | 0 refills | Status: DC

## 2018-07-23 MED ORDER — BUPROPION HCL ER (XL) 150 MG PO TB24: 150.0000 mg | ORAL_TABLET | Freq: Every day | ORAL | 0 refills | Status: DC

## 2018-07-23 NOTE — Telephone Encounter (Signed)
Pt's last office visit 09/10/17; no upcoming visits noted; contacted pt and made him aware; pt states that he has no insurance at this time; explained that $80 needed to be paid at the time of the visit, and the remaining balance will be billed to him; he asked if the visit could be paid for up front; explained to the pt that the charges would be based on the services that he receives that day; he verbalized understanding; pt offered and accepted appointment with Dr Deborra Medina, Audrie Lia, 09/12/18; he once again verbalized understanding; will route to office for notification of this upcoming appointment.      Requested Prescriptions  Pending Prescriptions Disp Refills  . buPROPion (WELLBUTRIN XL) 150 MG 24 hr tablet 60 tablet 0    Sig: Take 1 tablet (150 mg total) by mouth daily.     Psychiatry: Antidepressants - bupropion Failed - 07/22/2018  6:30 PM      Failed - Valid encounter within last 6 months    Recent Outpatient Visits          10 months ago Depression, recurrent [facility])   [facility] Deborra Medina, Marciano Sequin, MD   11 months ago Other fatigue   [facility] Lucille Passy, MD      Future Appointments            In 1 month Lucille Passy, MD [facility], [place] - Completed PHQ-2 or PHQ-9 in the last 360 days.      Passed - Last BP in normal range    BP Readings from Last 1 Encounters:  09/10/17 120/84       . rosuvastatin (CRESTOR) 5 MG tablet 60 tablet 0    Sig: Take 1 tablet (5 mg total) by mouth daily.     Cardiovascular:  Antilipid - Statins Failed - 07/22/2018  6:30 PM      Failed - Total Cholesterol in normal range and within 360 days    Cholesterol  Date Value Ref Range Status  11/20/2017 203 (H) 0 - 200 mg/dL Final    Comment:    ATP III Classification       Desirable:  < 200 mg/dL               Borderline High:  200 - 239 mg/dL          High:  > = 240 mg/dL         Failed - LDL in normal range and  within 360 days    No results found for: LDLCALC, LDLC, HIRISKLDL       Failed - Triglycerides in normal range and within 360 days    Triglycerides  Date Value Ref Range Status  11/20/2017 228.0 (H) 0.0 - 149.0 mg/dL Final    Comment:    Normal:  <150 mg/dLBorderline High:  150 - 199 mg/dL         Passed - HDL in normal range and within 360 days    HDL  Date Value Ref Range Status  11/20/2017 42.20 >39.00 mg/dL Final         Passed - Patient is not pregnant      Passed - Valid encounter within last 12 months    Recent Outpatient Visits          10 months ago Depression, recurrent [facility])   [facility] [address] [place], [place],  MD   11 months ago Other fatigue   [facility] [address], Marciano Sequin, MD      Future Appointments            In 1 month Lucille Passy, MD [facility], [place]         . fenofibrate (TRICOR) 145 MG tablet 60 tablet 0    Sig: Take 1 tablet (145 mg total) by mouth daily.     Cardiovascular:  Antilipid - Fibric Acid Derivatives Failed - 07/22/2018  6:30 PM      Failed - Total Cholesterol in normal range and within 360 days    Cholesterol  Date Value Ref Range Status  11/20/2017 203 (H) 0 - 200 mg/dL Final    Comment:    ATP III Classification       Desirable:  < 200 mg/dL               Borderline High:  200 - 239 mg/dL          High:  > = 240 mg/dL         Failed - LDL in normal range and within 360 days    No results found for: LDLCALC, LDLC, HIRISKLDL       Failed - Triglycerides in normal range and within 360 days    Triglycerides  Date Value Ref Range Status  11/20/2017 228.0 (H) 0.0 - 149.0 mg/dL Final    Comment:    Normal:  <150 mg/dLBorderline High:  150 - 199 mg/dL         Failed - ALT in normal range and within 180 days    ALT  Date Value Ref Range Status  09/10/2017 18 0 - 53 U/L Final         Failed - AST in normal range and within 180 days    AST  Date Value Ref Range Status   09/10/2017 13 0 - 37 U/L Final         Failed - Cr in normal range and within 180 days    Creatinine, Ser  Date Value Ref Range Status  09/10/2017 1.20 0.40 - 1.50 mg/dL Final         Failed - eGFR in normal range and within 180 days    GFR calc Af Amer  Date Value Ref Range Status  06/05/2016 >60 >60 mL/min Final    Comment:    (NOTE) The eGFR has been calculated using the CKD EPI equation. This calculation has not been validated in all clinical situations. eGFR's persistently <60 mL/min signify possible Chronic Kidney Disease.    GFR calc non Af Amer  Date Value Ref Range Status  06/05/2016 >60 >60 mL/min Final   GFR  Date Value Ref Range Status  09/10/2017 69.40 >60.00 mL/min Final         Passed - HDL in normal range and within 360 days    HDL  Date Value Ref Range Status  11/20/2017 42.20 >39.00 mg/dL Final         Passed - Valid encounter within last 12 months    Recent Outpatient Visits          10 months ago Depression, recurrent [facility])   [facility] Lucille Passy, MD   11 months ago Other fatigue   [facility] Lucille Passy, MD      Future Appointments  In 1 month Lucille Passy, MD [facility]

## 2018-08-14 ENCOUNTER — Encounter: Payer: Self-pay | Admitting: Physician Assistant

## 2018-08-14 ENCOUNTER — Ambulatory Visit: Payer: Self-pay | Admitting: Physician Assistant

## 2018-08-14 VITALS — BP 124/84 | HR 88 | Temp 98.4°F | Resp 14 | Wt 181.8 lb

## 2018-08-14 DIAGNOSIS — J019 Acute sinusitis, unspecified: Secondary | ICD-10-CM

## 2018-08-14 MED ORDER — AMOXICILLIN-POT CLAVULANATE 875-125 MG PO TABS: 1.0000 | ORAL_TABLET | Freq: Two times a day (BID) | ORAL | 0 refills | Status: DC

## 2018-08-14 MED ORDER — PREDNISONE 20 MG PO TABS: 40.0000 mg | ORAL_TABLET | Freq: Every day | ORAL | 0 refills | Status: AC

## 2018-08-14 MED ORDER — AZELASTINE HCL 0.1 % NA SOLN: 1.0000 | Freq: Two times a day (BID) | NASAL | 0 refills | Status: AC

## 2018-08-14 NOTE — Progress Notes (Signed)
MRN: [id number] DOB: Feb 20, 1971  Subjective:   Jeffrey Roach is a 48 y.o. male presenting for chief complaint of sinus pressure and congestion (x2 weeks (sudafed, and allegra D)) .  Reports 2 week history of worsening sinus pressure and nasal congestion.  It switches sides last which is the worst, but right now it is the left side.  Can feel drainage move from side to side and his forehead.  Mucus production is yellow to green in color.  Has associated decreased smelling.  Denies fever, visual disturbance, ear pain, cough, wheezing, shortness of breath, chest pain, numbness, tingling, and confusion.  Has been consistently trying Sudafed, Allegra, Zyrtec, and over-the-counter cold and flu medications with no full relief.  Will get temporary relief but then it returns.No known sick contact exposure.  PMH of seasonal allergies.  Denies PMH of asthma, hypertension, diabetes, thyroid disorder, and heart disease.  Did not have flu shot this season.  Denies smoking.  Denies any other aggravating or relieving factors, no other questions or concerns.  Review of Systems  Constitutional: Negative for diaphoresis.  HENT: Negative for nosebleeds.   Eyes: Negative for blurred vision, double vision, photophobia, pain, discharge and redness.  Respiratory: Negative for hemoptysis.   Cardiovascular: Negative for chest pain.  Gastrointestinal: Negative for abdominal pain, nausea and vomiting.  Musculoskeletal: Negative for neck pain.  Skin: Negative for rash.  Neurological: Negative for dizziness, focal weakness, seizures and weakness.    Jeffrey Roach has a current medication list which includes the following prescription(s): amoxicillin-clavulanate, azelastine, bupropion, cyclobenzaprine, fenofibrate, linaclotide, prednisone, and rosuvastatin. Also is allergic to aspirin.  Jeffrey Roach  has a past medical history of H/O colonoscopy (2013), Hypercholesteremia, Hypotestosteronemia, Previous back surgery (03-2017), and Prolonged  Q-T interval on ECG. Also  has a past surgical history that includes Back surgery (N/A, 03/2016) and laparoscopy (2006).   Objective:   Vitals: BP 124/84   Pulse 88   Temp 98.4 F (36.9 C)   Resp 14   Wt 181 lb 12.8 oz (82.5 kg)   SpO2 98%   BMI 25.36 kg/m   Physical Exam Vitals signs reviewed.  Constitutional:      General: He is not in acute distress.    Appearance: He is well-developed. He is not ill-appearing or toxic-appearing.  HENT:     Head: Normocephalic and atraumatic.     Comments: No pain with palpation of bilateral temporal regions.      Right Ear: Ear canal and external ear normal.     Left Ear: Ear canal and external ear normal.     Ears:     Comments: Bilateral TMs are dull in appearance.  No erythema or bulging noted.  No perforation noted.    Nose: Mucosal edema ( Severe bilaterally) and congestion present.     Right Sinus: Maxillary sinus tenderness present. No frontal sinus tenderness.     Left Sinus: Maxillary sinus tenderness (L>R) and frontal sinus tenderness present.     Comments: Decreased nasal patency bilaterally.    Mouth/Throat:     Lips: Pink.     Mouth: Mucous membranes are moist.     Pharynx: Oropharynx is clear. Uvula midline. No pharyngeal swelling, oropharyngeal exudate, posterior oropharyngeal erythema or uvula swelling.     Tonsils: No tonsillar exudate or tonsillar abscesses.  Eyes:     Conjunctiva/sclera: Conjunctivae normal.     Pupils: Pupils are equal, round, and reactive to light.     Funduscopic exam:    Right eye: No  hemorrhage, AV nicking or papilledema. Red reflex present.        Left eye: No hemorrhage, AV nicking or papilledema. Red reflex present. Neck:     Musculoskeletal: Normal range of motion.  Pulmonary:     Effort: Pulmonary effort is normal.  Lymphadenopathy:     Head:     Right side of head: No submental, submandibular, tonsillar, preauricular, posterior auricular or occipital adenopathy.     Left side of  head: No submental, submandibular, tonsillar, preauricular, posterior auricular or occipital adenopathy.     Cervical: No cervical adenopathy.     Upper Body:     Right upper body: No supraclavicular adenopathy.     Left upper body: No supraclavicular adenopathy.  Skin:    General: Skin is warm and dry.  Neurological:     Mental Status: He is alert and oriented to person, place, and time.     Cranial Nerves: Cranial nerves are intact.     Sensory: No sensory deficit.     Coordination: Finger-Nose-Finger Test normal.     Gait: Gait is intact.     Deep Tendon Reflexes: Reflexes are normal and symmetric.     No results found for this or any previous visit (from the past 24 hour(s)).  Assessment and Plan :  1. Acute sinusitis, recurrence not specified, unspecified location Patient is overall well appearing, NAD. VSS. No acute findings on neuro exam. Hx and PE findings are consistent with sinusitis. Due to duration, symptoms, and no improvement with conservative management, will treat with oral antibiotics at this time. Also rec short course oral prednisone due to degree of congestion. Recommend continuing symptomatic treatment as well. Advised to f/u with family doctor if no improvement with treatment plan or to seek care sooner if symptoms worsen/develops new concerning symptoms. Patient voices understanding.  - amoxicillin-clavulanate (AUGMENTIN) 875-125 MG tablet; Take 1 tablet by mouth 2 (two) times daily.  Dispense: 14 tablet; Refill: 0 - predniSONE (DELTASONE) 20 MG tablet; Take 2 tablets (40 mg total) by mouth daily with breakfast for 3 days.  Dispense: 6 tablet; Refill: 0 - azelastine (ASTELIN) 0.1 % nasal spray; Place 1 spray into both nostrils 2 (two) times daily. Use in each nostril as directed  Dispense: 30 mL; Refill: 0   Benjiman Core, Cordelia Poche  [facility] 08/14/2018 3:32 PM

## 2018-08-14 NOTE — Patient Instructions (Signed)
Sinusitis, Adult    Take antibiotic as prescribed to cover for bacteria.   Take prednisone for the next few days. Prednisone is a steroid and can cause side effects such as headache, irritability, nausea, vomiting, increased heart rate, increased blood pressure, increased blood sugar, appetite changes, and insomnia. Please take tablets in the morning with a full meal to help decrease the chances of these side effects.   Continue using oral zyrtec or allegra.   Use astelin nasal spray daily.  Use nasal saline rinses, which you can get over the counter, at night time.   Follow up with your family doctor if no improvement with treatment plan. Seek care sooner at ED if you develop any visual issues, confusion, severe headache, nausea, or vomiting.   Sinusitis is soreness and swelling (inflammation) of your sinuses. Sinuses are hollow spaces in the bones around your face. They are located:  Around your eyes.  In the middle of your forehead.  Behind your nose.  In your cheekbones. Your sinuses and nasal passages are lined with a fluid called mucus. Mucus drains out of your sinuses. Swelling can trap mucus in your sinuses. This lets germs (bacteria, virus, or fungus) grow, which leads to infection. Most of the time, this condition is caused by a virus. What are the causes? This condition is caused by:  Allergies.  Asthma.  Germs.  Things that block your nose or sinuses.  Growths in the nose (nasal polyps).  Chemicals or irritants in the air.  Fungus (rare). What increases the risk? You are more likely to develop this condition if:  You have a weak body defense system (immune system).  You do a lot of swimming or diving.  You use nasal sprays too much.  You smoke. What are the signs or symptoms? The main symptoms of this condition are pain and a feeling of pressure around the sinuses. Other symptoms include:  Stuffy nose (congestion).  Runny nose  (drainage).  Swelling and warmth in the sinuses.  Headache.  Toothache.  A cough that may get worse at night.  Mucus that collects in the throat or the back of the nose (postnasal drip).  Being unable to smell and taste.  Being very tired (fatigue).  A fever.  Sore throat.  Bad breath. How is this diagnosed? This condition is diagnosed based on:  Your symptoms.  Your medical history.  A physical exam.  Tests to find out if your condition is short-term (acute) or long-term (chronic). Your doctor may: ? Check your nose for growths (polyps). ? Check your sinuses using a tool that has a light (endoscope). ? Check for allergies or germs. ? Do imaging tests, such as an MRI or CT scan. How is this treated? Treatment for this condition depends on the cause and whether it is short-term or long-term.  If caused by a virus, your symptoms should go away on their own within 10 days. You may be given medicines to relieve symptoms. They include: ? Medicines that shrink swollen tissue in the nose. ? Medicines that treat allergies (antihistamines). ? A spray that treats swelling of the nostrils. ? Rinses that help get rid of thick mucus in your nose (nasal saline washes).  If caused by bacteria, your doctor may wait to see if you will get better without treatment. You may be given antibiotic medicine if you have: ? A very bad infection. ? A weak body defense system.  If caused by growths in the nose, you may  need to have surgery. Follow these instructions at home: Medicines  Take, use, or apply over-the-counter and prescription medicines only as told by your doctor. These may include nasal sprays.  If you were prescribed an antibiotic medicine, take it as told by your doctor. Do not stop taking the antibiotic even if you start to feel better. Hydrate and humidify   Drink enough water to keep your pee (urine) pale yellow.  Use a cool mist humidifier to keep the humidity  level in your home above 50%.  Breathe in steam for 10-15 minutes, 3-4 times a day, or as told by your doctor. You can do this in the bathroom while a hot shower is running.  Try not to spend time in cool or dry air. Rest  Rest as much as you can.  Sleep with your head raised (elevated).  Make sure you get enough sleep each night. General instructions   Put a warm, moist washcloth on your face 3-4 times a day, or as often as told by your doctor. This will help with discomfort.  Wash your hands often with soap and water. If there is no soap and water, use hand sanitizer.  Do not smoke. Avoid being around people who are smoking (secondhand smoke).  Keep all follow-up visits as told by your doctor. This is important. Contact a doctor if:  You have a fever.  Your symptoms get worse.  Your symptoms do not get better within 10 days. Get help right away if:  You have a very bad headache.  You cannot stop throwing up (vomiting).  You have very bad pain or swelling around your face or eyes.  You have trouble seeing.  You feel confused.  Your neck is stiff.  You have trouble breathing. Summary  Sinusitis is swelling of your sinuses. Sinuses are hollow spaces in the bones around your face.  This condition is caused by tissues in your nose that become inflamed or swollen. This traps germs. These can lead to infection.  If you were prescribed an antibiotic medicine, take it as told by your doctor. Do not stop taking it even if you start to feel better.  Keep all follow-up visits as told by your doctor. This is important. This information is not intended to replace advice given to you by your health care provider. Make sure you discuss any questions you have with your health care provider. Document Released: 01/10/2008 Document Revised: 12/24/2017 Document Reviewed: 12/24/2017 Elsevier Interactive Patient Education  2019 [employer].

## 2018-08-23 ENCOUNTER — Encounter: Payer: Self-pay | Admitting: Physician Assistant

## 2018-09-12 ENCOUNTER — Encounter: Payer: Self-pay | Admitting: Family Medicine

## 2018-09-12 ENCOUNTER — Encounter: Payer: Self-pay | Admitting: Physician Assistant

## 2018-09-12 ENCOUNTER — Ambulatory Visit: Payer: Self-pay | Admitting: Family Medicine

## 2018-09-12 VITALS — BP 124/82 | HR 75 | Temp 98.5°F | Ht 71.0 in | Wt 180.6 lb

## 2018-09-12 DIAGNOSIS — E781 Pure hyperglyceridemia: Secondary | ICD-10-CM

## 2018-09-12 DIAGNOSIS — F339 Major depressive disorder, recurrent, unspecified: Secondary | ICD-10-CM

## 2018-09-12 DIAGNOSIS — E291 Testicular hypofunction: Secondary | ICD-10-CM

## 2018-09-12 LAB — LIPID PANEL
Cholesterol: 129 mg/dL (ref 0–200)
HDL: 53.5 mg/dL (ref >39.00)
LDL Cholesterol: 58 mg/dL (ref 0–99)
NonHDL: 75.59
Total CHOL/HDL Ratio: 2
Triglycerides: 90 mg/dL (ref 0.0–149.0)
VLDL: 18 mg/dL (ref 0.0–40.0)

## 2018-09-12 MED ORDER — ROSUVASTATIN CALCIUM 5 MG PO TABS: 5.0000 mg | ORAL_TABLET | Freq: Every day | ORAL | 3 refills | Status: DC

## 2018-09-12 MED ORDER — FENOFIBRATE 145 MG PO TABS: 145.0000 mg | ORAL_TABLET | Freq: Every day | ORAL | 3 refills | Status: DC

## 2018-09-12 MED ORDER — BUPROPION HCL ER (XL) 300 MG PO TB24: 300.0000 mg | ORAL_TABLET | Freq: Every day | ORAL | 3 refills | Status: DC

## 2018-09-12 NOTE — Patient Instructions (Signed)
It was great to see you. We are increasing your Wellbutrin XL 300 mg daily. Please update me in a month.

## 2018-09-12 NOTE — Progress Notes (Signed)
Subjective:   Patient ID: Jeffrey Roach, male    DOB: 10-Dec-1970, 48 y.o.   MRN: [id number]  TAUHEED Roach is a pleasant 48 y.o. year old male who presents to clinic today with Follow-up (Patient is here today for a medication F/U.  He curretly takesWellbutrin XL '150mg'$  and is compliant but is feeling that he needs this increased. See PHQ-9 & GAD-7.  He takes Crestor '5mg'$  and Fenofibrate '145mg'$  for his cholesterol and is compliant.  He is fasting. (Pt is without insurance))  on 09/12/2018  HPI:  Here for follow up.  Last saw him on 09/10/17.  Note reviewed.  Depression- has been taking Wellbutrin XL 150 mg daily.  He feels it is was working well until last several months.  Still on workers comp and went through a recent divorce. She has trouble concentrating, more sadness, tearfulness. On PHQ he stated he had suicidal thoughts but her pt, he he hasn't had any suicidal thoughts.  There are days he would feel like it would be okay if he didn't wake up the next day.  No plan.  He states he would never kill himself.  Depression screen [facility] 2/9 09/12/2018 08/09/2017  Decreased Interest 2 1  Down, Depressed, Hopeless 2 1  PHQ - 2 Score 4 2  Altered sleeping 2 3  Tired, decreased energy 2 3  Change in appetite 1 0  Feeling bad or failure about yourself  2 2  Trouble concentrating 1 3  Moving slowly or fidgety/restless 2 0  Suicidal thoughts 2 1  PHQ-9 Score 16 14  Difficult doing work/chores Somewhat difficult Very difficult   GAD 7 : Generalized Anxiety Score 09/12/2018  Nervous, Anxious, on Edge 1  Control/stop worrying 2  Worry too much - different things 2  Trouble relaxing 2  Restless 2  Easily annoyed or irritable 1  Afraid - awful might happen 1  Total GAD 7 Score 11  Anxiety Difficulty Somewhat difficult      HLD- compliant with crestor and fenofibrate. Lab Results  Component Value Date   CHOL 203 (H) 11/20/2017   HDL 42.20 11/20/2017   LDLDIRECT 142.0 11/20/2017   TRIG  228.0 (H) 11/20/2017   CHOLHDL 5 11/20/2017   Lab Results  Component Value Date   ALT 18 09/10/2017   AST 13 09/10/2017   ALKPHOS 74 09/10/2017   BILITOT 0.4 09/10/2017   Current Outpatient Medications on File Prior to Visit  Medication Sig Dispense Refill  . azelastine (ASTELIN) 0.1 % nasal spray Place 1 spray into both nostrils 2 (two) times daily. Use in each nostril as directed 30 mL 0  . buPROPion (WELLBUTRIN XL) 150 MG 24 hr tablet Take 1 tablet (150 mg total) by mouth daily. 60 tablet 0  . cyclobenzaprine (FLEXERIL) 10 MG tablet Take 10 mg by mouth 3 (three) times daily as needed.    . linaclotide (LINZESS) 290 MCG CAPS capsule Take 1 capsule (290 mcg total) by mouth daily before breakfast. (Patient not taking: Reported on 09/12/2018) 30 capsule 6   No current facility-administered medications on file prior to visit.     Allergies  Allergen Reactions  . Aspirin Other (See Comments)    Due to increase bleeding    Past Medical History:  Diagnosis Date  . H/O colonoscopy 2013   Polyps  . Hypercholesteremia   . Hypotestosteronemia   . Previous back surgery 03-2017  . Prolonged Q-T interval on ECG     Past Surgical  History:  Procedure Laterality Date  . BACK SURGERY N/A 03/2016   L5-S1 Spinal Fusion  . LAPAROSCOPY  2006   Small Bowel Obstruction    Family History  Problem Relation Age of Onset  . Alcohol abuse Father   . Cancer Father   . COPD Father   . Diabetes Father   . Hypertension Father   . Cancer Maternal Grandfather   . Early death Paternal Grandmother     Social History   Socioeconomic History  . Marital status: Married    Spouse name: Not on file  . Number of children: Not on file  . Years of education: Not on file  . Highest education level: Not on file  Occupational History  . Not on file  Social Needs  . Financial resource strain: Not on file  . Food insecurity:    Worry: Not on file    Inability: Not on file  . Transportation needs:      Medical: Not on file    Non-medical: Not on file  Tobacco Use  . Smoking status: Never Smoker  . Smokeless tobacco: Current User  Substance and Sexual Activity  . Alcohol use: Yes    Alcohol/week: 2.0 standard drinks    Types: 2 Cans of beer per week  . Drug use: No  . Sexual activity: Not on file  Lifestyle  . Physical activity:    Days per week: Not on file    Minutes per session: Not on file  . Stress: Not on file  Relationships  . Social connections:    Talks on phone: Not on file    Gets together: Not on file    Attends religious service: Not on file    Active member of club or organization: Not on file    Attends meetings of clubs or organizations: Not on file    Relationship status: Not on file  . Intimate partner violence:    Fear of current or ex partner: Not on file    Emotionally abused: Not on file    Physically abused: Not on file    Forced sexual activity: Not on file  Other Topics Concern  . Not on file  Social History Narrative  . Not on file   The PMH, PSH, Social History, Family History, Medications, and allergies have been reviewed in [facility], and have been updated if relevant.    Review of Systems  Constitutional: Negative.   Psychiatric/Behavioral: Positive for decreased concentration and dysphoric mood. Negative for agitation, behavioral problems, confusion, hallucinations, self-injury, sleep disturbance and suicidal ideas. The patient is nervous/anxious. The patient is not hyperactive.   All other systems reviewed and are negative.      Objective:    BP 124/82 (BP Location: Left Arm, Patient Position: Sitting, Cuff Size: Normal)   Pulse 75   Temp 98.5 F (36.9 C) (Oral)   Ht '5\' 11"'$  (1.803 m)   Wt 180 lb 9.6 oz (81.9 kg)   SpO2 98%   BMI 25.19 kg/m    Physical Exam   General:  pleasant male in no acute distress Eyes:  PERRL Ears:  External ear exam shows no significant lesions or deformities.  TMs normal bilaterally Hearing is  grossly normal bilaterally. Nose:  External nasal examination shows no deformity or inflammation. Nasal mucosa are pink and moist without lesions or exudates. Mouth:  Oral mucosa and oropharynx without lesions or exudates.  Teeth in good repair. Neck:  no carotid bruit or thyromegaly no  cervical or supraclavicular lymphadenopathy  Lungs:  Normal respiratory effort, chest expands symmetrically. Lungs are clear to auscultation, no crackles or wheezes. Heart:  Normal rate and regular rhythm.  Extremities:  no edema  Psych:  Good eye contact, not anxious or depressed appearing      Assessment & Plan:   Depression, recurrent ([place]) - Plan: CANCELED: Comp Met (CMET)  Hypertriglyceridemia - Plan: Lipid Profile, CANCELED: Comp Met (CMET)  Hypogonadism in male - Plan: CANCELED: Testosterone, CANCELED: Comp Met (CMET) No follow-ups on file.

## 2018-09-12 NOTE — Assessment & Plan Note (Signed)
Deteriorated. Denies suicidal or homicidal thoughts. Increase Wellbutrin 300 XL daily. Please update me in 1 month with how you're doing.

## 2018-09-12 NOTE — Assessment & Plan Note (Signed)
Continue current rxs Labs today. 

## 2018-11-29 ENCOUNTER — Encounter: Payer: Self-pay | Admitting: Family Medicine

## 2018-11-30 ENCOUNTER — Other Ambulatory Visit: Payer: Self-pay | Admitting: Family Medicine

## 2018-11-30 MED ORDER — FENOFIBRATE 145 MG PO TABS: 145.0000 mg | ORAL_TABLET | Freq: Every day | ORAL | 3 refills | Status: DC

## 2018-11-30 MED ORDER — ROSUVASTATIN CALCIUM 5 MG PO TABS: 5.0000 mg | ORAL_TABLET | Freq: Every day | ORAL | 3 refills | Status: DC

## 2018-11-30 MED ORDER — BUPROPION HCL ER (XL) 300 MG PO TB24: 300.0000 mg | ORAL_TABLET | Freq: Every day | ORAL | 3 refills | Status: AC

## 2019-03-31 ENCOUNTER — Encounter: Payer: Self-pay | Admitting: Physician Assistant

## 2019-03-31 ENCOUNTER — Encounter: Payer: Self-pay | Admitting: Family Medicine

## 2019-04-01 ENCOUNTER — Other Ambulatory Visit: Payer: Self-pay

## 2019-04-01 ENCOUNTER — Telehealth: Payer: Self-pay

## 2019-04-01 DIAGNOSIS — R5383 Other fatigue: Secondary | ICD-10-CM

## 2019-04-01 DIAGNOSIS — E782 Mixed hyperlipidemia: Secondary | ICD-10-CM

## 2019-04-01 DIAGNOSIS — E781 Pure hyperglyceridemia: Secondary | ICD-10-CM

## 2019-04-01 NOTE — Addendum Note (Signed)
Addended by: Lucille Passy on: 04/01/2019 08:17 PM   Modules accepted: Orders

## 2019-04-01 NOTE — Telephone Encounter (Signed)

## 2019-04-01 NOTE — Telephone Encounter (Signed)
Yes you can add an a1c.  I will place order now.  Thanks!

## 2019-04-01 NOTE — Telephone Encounter (Signed)
I called pt to do the prescreen before tomorrow mornings lab appt. He wanted me to ask you if you will order a lab to check his blood sugar.  Reason given was; he has never had it checked before and wants to make sure its where it should be.  Thanks, Levada Dy

## 2019-04-02 ENCOUNTER — Other Ambulatory Visit: Payer: Self-pay | Admitting: Family Medicine

## 2019-04-02 ENCOUNTER — Other Ambulatory Visit (INDEPENDENT_AMBULATORY_CARE_PROVIDER_SITE_OTHER): Payer: Self-pay

## 2019-04-02 ENCOUNTER — Encounter: Payer: Self-pay | Admitting: Family Medicine

## 2019-04-02 ENCOUNTER — Other Ambulatory Visit: Payer: Self-pay

## 2019-04-02 DIAGNOSIS — E782 Mixed hyperlipidemia: Secondary | ICD-10-CM

## 2019-04-02 DIAGNOSIS — R5383 Other fatigue: Secondary | ICD-10-CM

## 2019-04-02 DIAGNOSIS — E781 Pure hyperglyceridemia: Secondary | ICD-10-CM

## 2019-04-02 LAB — LIPID PANEL
Cholesterol: 232 mg/dL — ABNORMAL HIGH (ref 0–200)
HDL: 41.9 mg/dL (ref >39.00)
NonHDL: 190.18
Total CHOL/HDL Ratio: 6
Triglycerides: 313 mg/dL — ABNORMAL HIGH (ref 0.0–149.0)
VLDL: 62.6 mg/dL — ABNORMAL HIGH (ref 0.0–40.0)

## 2019-04-02 LAB — COMPREHENSIVE METABOLIC PANEL
ALT: 23 U/L (ref 0–53)
AST: 20 U/L (ref 0–37)
Albumin: 4.6 g/dL (ref 3.5–5.2)
Alkaline Phosphatase: 63 U/L (ref 39–117)
BUN: 17 mg/dL (ref 6–23)
CO2: 27 mEq/L (ref 19–32)
Calcium: 9.6 mg/dL (ref 8.4–10.5)
Chloride: 102 mEq/L (ref 96–112)
Creatinine, Ser: 1.1 mg/dL (ref 0.40–1.50)
GFR: 71.7 mL/min (ref >60.00)
Glucose, Bld: 93 mg/dL (ref 70–99)
Potassium: 4.1 mEq/L (ref 3.5–5.1)
Sodium: 138 mEq/L (ref 135–145)
Total Bilirubin: 0.6 mg/dL (ref 0.2–1.2)
Total Protein: 7.6 g/dL (ref 6.0–8.3)

## 2019-04-02 LAB — HEMOGLOBIN A1C: Hgb A1c MFr Bld: 5.4 % (ref 4.6–6.5)

## 2019-04-02 LAB — LDL CHOLESTEROL, DIRECT: Direct LDL: 140 mg/dL

## 2019-04-02 MED ORDER — FENOFIBRATE 145 MG PO TABS: 145.0000 mg | ORAL_TABLET | Freq: Every day | ORAL | 3 refills | Status: AC

## 2019-04-02 MED ORDER — ROSUVASTATIN CALCIUM 5 MG PO TABS: 5.0000 mg | ORAL_TABLET | Freq: Every day | ORAL | 3 refills | Status: AC

## 2019-04-21 ENCOUNTER — Encounter: Payer: Self-pay | Admitting: Family Medicine

## 2024-01-25 DIAGNOSIS — M25562 Pain in left knee: Secondary | ICD-10-CM | POA: Diagnosis not present

## 2024-01-25 DIAGNOSIS — M25561 Pain in right knee: Secondary | ICD-10-CM | POA: Diagnosis not present

## 2024-04-09 DIAGNOSIS — M545 Low back pain, unspecified: Secondary | ICD-10-CM | POA: Diagnosis not present

## 2024-04-09 DIAGNOSIS — M4326 Fusion of spine, lumbar region: Secondary | ICD-10-CM | POA: Diagnosis not present

## 2024-04-09 DIAGNOSIS — M47816 Spondylosis without myelopathy or radiculopathy, lumbar region: Secondary | ICD-10-CM | POA: Diagnosis not present
